# Patient Record
Sex: Male | Born: 1995
Health system: Southern US, Community
[De-identification: ages and names within clinical notes are randomized; demographics above are authoritative.]

## PROBLEM LIST (undated history)

## (undated) DIAGNOSIS — J45909 Unspecified asthma, uncomplicated: Secondary | ICD-10-CM

---
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
## 2017-03-25 ENCOUNTER — Encounter: Payer: Self-pay | Admitting: Family Medicine

## 2017-03-25 ENCOUNTER — Ambulatory Visit (INDEPENDENT_AMBULATORY_CARE_PROVIDER_SITE_OTHER): Payer: BLUE CROSS/BLUE SHIELD | Admitting: Family Medicine

## 2017-03-25 DIAGNOSIS — R0989 Other specified symptoms and signs involving the circulatory and respiratory systems: Secondary | ICD-10-CM

## 2017-03-25 DIAGNOSIS — J45909 Unspecified asthma, uncomplicated: Secondary | ICD-10-CM | POA: Insufficient documentation

## 2017-03-25 DIAGNOSIS — B078 Other viral warts: Secondary | ICD-10-CM | POA: Insufficient documentation

## 2017-03-25 DIAGNOSIS — Z808 Family history of malignant neoplasm of other organs or systems: Secondary | ICD-10-CM

## 2017-03-25 DIAGNOSIS — J4520 Mild intermittent asthma, uncomplicated: Secondary | ICD-10-CM

## 2017-03-25 DIAGNOSIS — J989 Respiratory disorder, unspecified: Secondary | ICD-10-CM | POA: Insufficient documentation

## 2017-03-25 DIAGNOSIS — B079 Viral wart, unspecified: Secondary | ICD-10-CM

## 2017-03-25 DIAGNOSIS — J3089 Other allergic rhinitis: Secondary | ICD-10-CM

## 2017-03-25 NOTE — Patient Instructions (Signed)
 Generalized Anxiety Disorder, Adult  Generalized anxiety disorder (GAD) is a mental health disorder. People with this condition constantly worry about everyday events. Unlike normal anxiety, worry related to GAD is not triggered by a specific event. These worries also do not fade or get better with time. GAD interferes with life functions, including relationships, work, and school. GAD can vary from mild to severe. People with severe GAD can have intense waves of anxiety with physical symptoms (panic attacks). What are the causes? The exact cause of GAD is not known. What increases the risk? This condition is more likely to develop in:  Women.  People who have a family history of anxiety disorders.  People who are very shy.  People who experience very stressful life events, such as the death of a loved one.  People who have a very stressful family environment.  What are the signs or symptoms? People with GAD often worry excessively about many things in their lives, such as their health and family. They may also be overly concerned about:  Doing well at work.  Being on time.  Natural disasters.  Friendships.  Physical symptoms of GAD include:  Fatigue.  Muscle tension or having muscle twitches.  Trembling or feeling shaky.  Being easily startled.  Feeling like your heart is pounding or racing.  Feeling out of breath or like you cannot take a deep breath.  Having trouble falling asleep or staying asleep.  Sweating.  Nausea, diarrhea, or irritable bowel syndrome (IBS).  Headaches.  Trouble concentrating or remembering facts.  Restlessness.  Irritability.  How is this diagnosed? Your health care provider can diagnose GAD based on your symptoms and medical history. You will also have a physical exam. The health care provider will ask specific questions about your symptoms, including how severe they are, when they started, and if they come and go. Your health  care provider may ask you about your use of alcohol or drugs, including prescription medicines. Your health care provider may refer you to a mental health specialist for further evaluation. Your health care provider will do a thorough examination and may perform additional tests to rule out other possible causes of your symptoms. To be diagnosed with GAD, a person must have anxiety that:  Is out of his or her control.  Affects several different aspects of his or her life, such as work and relationships.  Causes distress that makes him or her unable to take part in normal activities.  Includes at least three physical symptoms of GAD, such as restlessness, fatigue, trouble concentrating, irritability, muscle tension, or sleep problems.  Before your health care provider can confirm a diagnosis of GAD, these symptoms must be present more days than they are not, and they must last for six months or longer. How is this treated? The following therapies are usually used to treat GAD:  Medicine. Antidepressant medicine is usually prescribed for long-term daily control. Antianxiety medicines may be added in severe cases, especially when panic attacks occur.  Talk therapy (psychotherapy). Certain types of talk therapy can be helpful in treating GAD by providing support, education, and guidance. Options include: ? Cognitive behavioral therapy (CBT). People learn coping skills and techniques to ease their anxiety. They learn to identify unrealistic or negative thoughts and behaviors and to replace them with positive ones. ? Acceptance and commitment therapy (ACT). This treatment teaches people how to be mindful as a way to cope with unwanted thoughts and feelings. ? Biofeedback. This process trains   you to manage your body's response (physiological response) through breathing techniques and relaxation methods. You will work with a therapist while machines are used to monitor your physical symptoms.  Stress  management techniques. These include yoga, meditation, and exercise.  A mental health specialist can help determine which treatment is best for you. Some people see improvement with one type of therapy. However, other people require a combination of therapies. Follow these instructions at home:  Take over-the-counter and prescription medicines only as told by your health care provider.  Try to maintain a normal routine.  Try to anticipate stressful situations and allow extra time to manage them.  Practice any stress management or self-calming techniques as taught by your health care provider.  Do not punish yourself for setbacks or for not making progress.  Try to recognize your accomplishments, even if they are small.  Keep all follow-up visits as told by your health care provider. This is important. Contact a health care provider if:  Your symptoms do not get better.  Your symptoms get worse.  You have signs of depression, such as: ? A persistently sad, cranky, or irritable mood. ? Loss of enjoyment in activities that used to bring you joy. ? Change in weight or eating. ? Changes in sleeping habits. ? Avoiding friends or family members. ? Loss of energy for normal tasks. ? Feelings of guilt or worthlessness. Get help right away if:  You have serious thoughts about hurting yourself or others. If you ever feel like you may hurt yourself or others, or have thoughts about taking your own life, get help right away. You can go to your nearest emergency department or call:  Your local emergency services (911 in the [place]  A suicide crisis helpline, such as the National Suicide Prevention Lifeline at 1-800-273-8255. This is open 24 hours a day.  Summary  Generalized anxiety disorder (GAD) is a mental health disorder that involves worry that is not triggered by a specific event.  People with GAD often worry excessively about many things in their lives, such as their health and  family.  GAD may cause physical symptoms such as restlessness, trouble concentrating, sleep problems, frequent sweating, nausea, diarrhea, headaches, and trembling or muscle twitching.  A mental health specialist can help determine which treatment is best for you. Some people see improvement with one type of therapy. However, other people require a combination of therapies. This information is not intended to replace advice given to you by your health care provider. Make sure you discuss any questions you have with your health care provider. Document Released: 08/11/2012 Document Revised: 03/06/2016 Document Reviewed: 03/06/2016 Elsevier Interactive Patient Education  2018 [employer]      Living With Anxiety  After being diagnosed with an anxiety disorder, you may be relieved to know why you have felt or behaved a certain way. It is natural to also feel overwhelmed about the treatment ahead and what it will mean for your life. With care and support, you can manage this condition and recover from it. How to cope with anxiety Dealing with stress Stress is your body's reaction to life changes and events, both good and bad. Stress can last just a few hours or it can be ongoing. Stress can play a major role in anxiety, so it is important to learn both how to cope with stress and how to think about it differently. Talk with your health care provider or a counselor to learn more about stress reduction. He   or she may suggest some stress reduction techniques, such as:  Music therapy. This can include creating or listening to music that you enjoy and that inspires you.  Mindfulness-based meditation. This involves being aware of your normal breaths, rather than trying to control your breathing. It can be done while sitting or walking.  Centering prayer. This is a kind of meditation that involves focusing on a word, phrase, or sacred image that is meaningful to you and that brings you peace.  Deep  breathing. To do this, expand your stomach and inhale slowly through your nose. Hold your breath for 3-5 seconds. Then exhale slowly, allowing your stomach muscles to relax.  Self-talk. This is a skill where you identify thought patterns that lead to anxiety reactions and correct those thoughts.  Muscle relaxation. This involves tensing muscles then relaxing them.  Choose a stress reduction technique that fits your lifestyle and personality. Stress reduction techniques take time and practice. Set aside 5-15 minutes a day to do them. Therapists can offer training in these techniques. The training may be covered by some insurance plans. Other things you can do to manage stress include:  Keeping a stress diary. This can help you learn what triggers your stress and ways to control your response.  Thinking about how you respond to certain situations. You may not be able to control everything, but you can control your reaction.  Making time for activities that help you relax, and not feeling guilty about spending your time in this way.  Therapy combined with coping and stress-reduction skills provides the best chance for successful treatment. Medicines Medicines can help ease symptoms. Medicines for anxiety include:  Anti-anxiety drugs.  Antidepressants.  Beta-blockers.  Medicines may be used as the main treatment for anxiety disorder, along with therapy, or if other treatments are not working. Medicines should be prescribed by a health care provider. Relationships Relationships can play a big part in helping you recover. Try to spend more time connecting with trusted friends and family members. Consider going to couples counseling, taking family education classes, or going to family therapy. Therapy can help you and others better understand the condition. How to recognize changes in your condition Everyone has a different response to treatment for anxiety. Recovery from anxiety happens when  symptoms decrease and stop interfering with your daily activities at home or work. This may mean that you will start to:  Have better concentration and focus.  Sleep better.  Be less irritable.  Have more energy.  Have improved memory.  It is important to recognize when your condition is getting worse. Contact your health care provider if your symptoms interfere with home or work and you do not feel like your condition is improving. Where to find help and support: You can get help and support from these sources:  Self-help groups.  Online and community organizations.  A trusted spiritual leader.  Couples counseling.  Family education classes.  Family therapy.  Follow these instructions at home:  Eat a healthy diet that includes plenty of vegetables, fruits, whole grains, low-fat dairy products, and lean protein. Do not eat a lot of foods that are high in solid fats, added sugars, or salt.  Exercise. Most adults should do the following: ? Exercise for at least 150 minutes each week. The exercise should increase your heart rate and make you sweat (moderate-intensity exercise). ? Strengthening exercises at least twice a week.  Cut down on caffeine, tobacco, alcohol, and other potentially harmful substances.    substances.  Get the right amount and quality of sleep. Most adults need 7-9 hours of sleep each night.  Make choices that simplify your life.  Take over-the-counter and prescription medicines only as told by your health care provider.  Avoid caffeine, alcohol, and certain over-the-counter cold medicines. These may make you feel worse. Ask your pharmacist which medicines to avoid.  Keep all follow-up visits as told by your health care provider. This is important. Questions to ask your health care provider  Would I benefit from therapy?  How often should I follow up with a health care provider?  How long do I need to take medicine?  Are there any  long-term side effects of my medicine?  Are there any alternatives to taking medicine? Contact a health care provider if:  You have a hard time staying focused or finishing daily tasks.  You spend many hours a day feeling worried about everyday life.  You become exhausted by worry.  You start to have headaches, feel tense, or have nausea.  You urinate more than normal.  You have diarrhea. Get help right away if:  You have a racing heart and shortness of breath.  You have thoughts of hurting yourself or others. If you ever feel like you may hurt yourself or others, or have thoughts about taking your own life, get help right away. You can go to your nearest emergency department or call:  Your local emergency services (911 in the [place]  A suicide crisis helpline, such as the National Suicide Prevention Lifeline at 215-861-1255. This is open 24-hours a day.  Summary  Taking steps to deal with stress can help calm you.  Medicines cannot cure anxiety disorders, but they can help ease symptoms.  Family, friends, and partners can play a big part in helping you recover from an anxiety disorder. This information is not intended to replace advice given to you by your health care provider. Make sure you discuss any questions you have with your health care provider. Document Released: 04/10/2016 Document Revised: 04/10/2016 Document Reviewed: 04/10/2016 Elsevier Interactive Patient Education  2018 [employer].    Please realize, EXERCISE IS MEDICINE!  -  American Heart Association ( AHA) guidelines for exercise : If you are in good health, without any medical conditions, you should engage in 150 minutes of moderate intensity aerobic activity per week.  This means you should be huffing and puffing throughout your workout.   Engaging in regular exercise will improve brain function and memory, as well as improve mood, boost immune system and help with weight management.  As well as the  other, more well-known effects of exercise such as decreasing blood sugar levels, decreasing blood pressure,  and decreasing bad cholesterol levels/ increasing good cholesterol levels.     -  The AHA strongly endorses consumption of a diet that contains a variety of foods from all the food categories with an emphasis on fruits and vegetables; fat-free and low-fat dairy products; cereal and grain products; legumes and nuts; and fish, poultry, and/or extra lean meats.    Excessive food intake, especially of foods high in saturated and trans fats, sugar, and salt, should be avoided.    Adequate water intake of roughly 1/2 of your weight in pounds, should equal the ounces of water per day you should drink.  So for instance, if you're 200 pounds, that would be 100 ounces of water per day.         Mediterranean Diet  Why follow  it? Research shows. . Those who follow the Mediterranean diet have a reduced risk of heart disease  . The diet is associated with a reduced incidence of Parkinson's and Alzheimer's diseases . People following the diet may have longer life expectancies and lower rates of chronic diseases  . The Dietary Guidelines for Americans recommends the Mediterranean diet as an eating plan to promote health and prevent disease  What Is the Mediterranean Diet?  . Healthy eating plan based on typical foods and recipes of Mediterranean-style cooking . The diet is primarily a plant based diet; these foods should make up a majority of meals   Starches - Plant based foods should make up a majority of meals - They are an important sources of vitamins, minerals, energy, antioxidants, and fiber - Choose whole grains, foods high in fiber and minimally processed items  - Typical grain sources include wheat, oats, barley, corn, brown rice, bulgar, farro, millet, polenta, couscous  - Various types of beans include chickpeas, lentils, fava beans, black beans, white beans   Fruits  Veggies - Large  quantities of antioxidant rich fruits & veggies; 6 or more servings  - Vegetables can be eaten raw or lightly drizzled with oil and cooked  - Vegetables common to the traditional Mediterranean Diet include: artichokes, arugula, beets, broccoli, brussel sprouts, cabbage, carrots, celery, collard greens, cucumbers, eggplant, kale, leeks, lemons, lettuce, mushrooms, okra, onions, peas, peppers, potatoes, pumpkin, radishes, rutabaga, shallots, spinach, sweet potatoes, turnips, zucchini - Fruits common to the Mediterranean Diet include: apples, apricots, avocados, cherries, clementines, dates, figs, grapefruits, grapes, melons, nectarines, oranges, peaches, pears, pomegranates, strawberries, tangerines  Fats - Replace butter and margarine with healthy oils, such as olive oil, canola oil, and tahini  - Limit nuts to no more than a handful a day  - Nuts include walnuts, almonds, pecans, pistachios, pine nuts  - Limit or avoid candied, honey roasted or heavily salted nuts - Olives are central to the [employer] diet - can be eaten whole or used in a variety of dishes   Meats Protein - Limiting red meat: no more than a few times a month - When eating red meat: choose lean cuts and keep the portion to the size of deck of cards - Eggs: approx. 0 to 4 times a week  - Fish and lean poultry: at least 2 a week  - Healthy protein sources include, chicken, [place], lean beef, lamb - Increase intake of seafood such as tuna, salmon, trout, mackerel, shrimp, scallops - Avoid or limit high fat processed meats such as sausage and bacon  Dairy - Include moderate amounts of low fat dairy products  - Focus on healthy dairy such as fat free yogurt, skim milk, low or reduced fat cheese - Limit dairy products higher in fat such as whole or 2% milk, cheese, ice cream  Alcohol - Moderate amounts of red wine is ok  - No more than 5 oz daily for women (all ages) and men older than age 20  - No more than 10 oz of wine daily  for men younger than 2465  Other - Limit sweets and other desserts  - Use herbs and spices instead of salt to flavor foods  - Herbs and spices common to the traditional Mediterranean Diet include: basil, bay leaves, chives, cloves, cumin, fennel, garlic, lavender, marjoram, mint, oregano, parsley, pepper, rosemary, sage, savory, sumac, tarragon, thyme   It's not just a diet, it's a lifestyle:  . The Mediterranean diet includes lifestyle  factors typical of those in the region  . Foods, drinks and meals are best eaten with others and savored . Daily physical activity is important for overall good health . This could be strenuous exercise like running and aerobics . This could also be more leisurely activities such as walking, housework, yard-work, or taking the stairs . Moderation is the key; a balanced and healthy diet accommodates most foods and drinks . Consider portion sizes and frequency of consumption of certain foods   Meal Ideas & Options:  . Breakfast:  o Whole wheat toast or whole wheat English muffins with peanut butter & hard boiled egg o Steel cut oats topped with apples & cinnamon and skim milk  o Fresh fruit: banana, strawberries, melon, berries, peaches  o Smoothies: strawberries, bananas, greek yogurt, peanut butter o Low fat greek yogurt with blueberries and granola  o Egg white omelet with spinach and mushrooms o Breakfast couscous: whole wheat couscous, apricots, skim milk, cranberries  . Sandwiches:  o Hummus and grilled vegetables (peppers, zucchini, squash) on whole wheat bread   o Grilled chicken on whole wheat pita with lettuce, tomatoes, cucumbers or tzatziki  o Tuna salad on whole wheat bread: tuna salad made with greek yogurt, olives, red peppers, capers, green onions o Garlic rosemary lamb pita: lamb sauted with garlic, rosemary, salt & pepper; add lettuce, cucumber, greek yogurt to pita - flavor with lemon juice and black pepper  . Seafood:  o Mediterranean  grilled salmon, seasoned with garlic, basil, parsley, lemon juice and black pepper o Shrimp, lemon, and spinach whole-grain pasta salad made with low fat greek yogurt  o Seared scallops with lemon orzo  o Seared tuna steaks seasoned salt, pepper, coriander topped with tomato mixture of olives, tomatoes, olive oil, minced garlic, parsley, green onions and cappers  . Meats:  o Herbed greek chicken salad with kalamata olives, cucumber, feta  o Red bell peppers stuffed with spinach, bulgur, lean ground beef (or lentils) & topped with feta   o Kebabs: skewers of chicken, tomatoes, onions, zucchini, squash  o [place] burgers: made with red onions, mint, dill, lemon juice, feta cheese topped with roasted red peppers . Vegetarian o Cucumber salad: cucumbers, artichoke hearts, celery, red onion, feta cheese, tossed in olive oil & lemon juice  o Hummus and whole grain pita points with a greek salad (lettuce, tomato, feta, olives, cucumbers, red onion) o Lentil soup with celery, carrots made with vegetable broth, garlic, salt and pepper  o Tabouli salad: parsley, bulgur, mint, scallions, cucumbers, tomato, radishes, lemon juice, olive oil, salt and pepper.

## 2017-03-25 NOTE — Progress Notes (Signed)
New patient office visit note:  Impression and Recommendations:    1. Mild intermittent childhood asthma without complication   2. Environmental and seasonal allergies   3. Reactive airway disease that is not asthma     1. Mild intermittent childhood asthma without complication ***  2. Environmental and seasonal allergies ***  3. Reactive airway disease that is not asthma ***   1. Mild intermittent childhood asthma without complication  2. Environmental and seasonal allergies  3. Reactive airway disease that is not asthma   No problem-specific Assessment & Plan notes found for this encounter.    Education and routine counseling performed. Handouts provided.  No orders of the defined types were placed in this encounter.   Gross side effects, risk and benefits, and alternatives of medications discussed with patient.  Patient is aware that all medications have potential side effects and we are unable to predict every side effect or drug-drug interaction that may occur.  Expresses verbal understanding and consents to current therapy plan and treatment regimen.  No Follow-up on file.  Please see AVS handed out to patient at the end of our visit for further patient instructions/ counseling done pertaining to today's office visit.    Note: This document was prepared using Dragon voice recognition software and may include unintentional dictation errors.  ----------------------------------------------------------------------------------------------------------------------    Subjective:    Chief complaint:   Chief Complaint  Patient presents with  . Establish Care     HPI: Nicholas Meadows is a pleasant 21 y.o. male who [facility] to review their medical history with me and establish care.   I asked the patient to review their chronic problem list with me to ensure everything was updated and accurate.    All  recent office visits with other providers, any medical records that patient brought in etc  - I reviewed today.     Also asked pt to get me medical records from [facility] providers/ specialists that they had seen within the past 3-5 years- if they are in private practice and/or do not work for a [facility], [facility], [facility], [facility] or [facility] owned practice.  Told them to call their specialists to clarify this if they are not sure.    -Right hand patient noticed a dark spot which is approximately 2 mm in diameter.  Been there for approximately a year and a half.  Has not changed in that time span.  Came on after he noticed some verrucea in the area.   Nop bleed, no itch, no changes in appearance.    Problem  Childhood Asthma Without Complication   Patient had childhood asthma but has not had to use his albuterol in many years now.  Usually worse in the winter when the cold weather hits.  Has a albuterol inhaler as well as nebulizer at home to use as needed   Environmental and Seasonal Allergies   OTC allergy meds and nose spray's- Spring and fall.     Reactive Airway Disease That Is Not Asthma      Wt Readings from Last 3 Encounters:  03/25/17 131 lb (59.4 kg) (12 %, Z= -1.16)*  10/21/11 130 lb 4.7 oz (59.1 kg) (68 %, Z= 0.47)*   * Growth percentiles are based on CDC (Boys, 2-20 Years) data.   BP Readings from Last 3 Encounters:  03/25/17 138/80 (95 %, Z = 1.64 /  88 %, Z =  1.19)*  10/21/11 121/78   *BP percentiles are based on the August 2017 AAP Clinical Practice Guideline for boys   Pulse Readings from Last 3 Encounters:  03/25/17 (!) 106  10/21/11 72   BMI Readings from Last 3 Encounters:  03/25/17 20.52 kg/m (17 %, Z= -0.95)*   * Growth percentiles are based on CDC (Boys, 2-20 Years) data.    Patient Care Team    Relationship Specialty Notifications Start End  Thomasene Lotpalski, Monifah Freehling, DO PCP - General Family Medicine  03/19/17     Patient Active Problem List   Diagnosis Date  Noted  . Childhood asthma without complication 03/25/2017  . Environmental and seasonal allergies 03/25/2017  . Reactive airway disease that is not asthma 03/25/2017     Past Medical History:  Diagnosis Date  . Asthma      Past Medical History:  Diagnosis Date  . Asthma      No past surgical history on file.   No family history on file.   Social History   Substance and Sexual Activity  Drug Use No     Social History   Substance and Sexual Activity  Alcohol Use No  . Frequency: Never     Social History   Tobacco Use  Smoking Status Current Every Day Smoker  . Types: E-cigarettes  Smokeless Tobacco Never Used  Tobacco Comment   patient uses vapor     Outpatient Encounter Medications as of 03/25/2017  Medication Sig  . albuterol (PROVENTIL) (2.5 MG/3ML) 0.083% nebulizer solution Take 2.5 mg by nebulization every 6 (six) hours as needed. For shortness of breath.  . [DISCONTINUED] PRESCRIPTION MEDICATION Inhale 1 puff into the lungs every 6 (six) hours as needed. Asthma inhaler. For shortness of breath.  . [DISCONTINUED] PRESCRIPTION MEDICATION Inhale 1 puff into the lungs daily as needed. Asthma rescue inhaler. For wheezing.   No facility-administered encounter medications on file as of 03/25/2017.     Allergies: Patient has no known allergies.   ROS   Objective:   Blood pressure 138/80, pulse (!) 106, height 5\' 7"  (1.702 m), weight 131 lb (59.4 kg). Body mass index is 20.52 kg/m. General: Well Developed, well nourished, and in no acute distress.  Neuro: Alert and oriented x3, extra-ocular muscles intact, sensation grossly intact.  HEENT:[place]/AT, PERRLA, neck supple, No carotid bruits Skin: no gross rashes  Cardiac: Regular rate and rhythm Respiratory: Essentially clear to auscultation bilaterally. Not using accessory muscles, speaking in full sentences.  Abdominal: not grossly distended Musculoskeletal: Ambulates w/o diff, FROM * 4 ext.  Vasc:  less 2 sec cap RF, warm and pink  Psych:  No HI/SI, judgement and insight good, Euthymic mood. Full Affect.    No results found for this or any previous visit (from the past 2160 hour(s)).

## 2017-09-10 ENCOUNTER — Ambulatory Visit (INDEPENDENT_AMBULATORY_CARE_PROVIDER_SITE_OTHER): Payer: BLUE CROSS/BLUE SHIELD | Admitting: Family Medicine

## 2017-09-10 ENCOUNTER — Encounter: Payer: Self-pay | Admitting: Family Medicine

## 2017-09-10 VITALS — BP 127/82 | HR 77 | Ht 67.0 in | Wt 152.3 lb

## 2017-09-10 DIAGNOSIS — H6123 Impacted cerumen, bilateral: Secondary | ICD-10-CM

## 2017-09-10 DIAGNOSIS — J4520 Mild intermittent asthma, uncomplicated: Secondary | ICD-10-CM | POA: Diagnosis not present

## 2017-09-10 DIAGNOSIS — J3089 Other allergic rhinitis: Secondary | ICD-10-CM | POA: Diagnosis not present

## 2017-09-10 DIAGNOSIS — H938X3 Other specified disorders of ear, bilateral: Secondary | ICD-10-CM | POA: Diagnosis not present

## 2017-09-10 MED ORDER — ALBUTEROL SULFATE HFA 108 (90 BASE) MCG/ACT IN AERS: 1.0000 | INHALATION_SPRAY | Freq: Four times a day (QID) | RESPIRATORY_TRACT | 2 refills | Status: DC | PRN

## 2017-09-10 MED ORDER — ALBUTEROL SULFATE (2.5 MG/3ML) 0.083% IN NEBU: 2.5000 mg | INHALATION_SOLUTION | Freq: Four times a day (QID) | RESPIRATORY_TRACT | 1 refills | Status: DC | PRN

## 2017-09-10 NOTE — Progress Notes (Signed)
Acute Care Office visit  Assessment and plan:  1. Bilateral hearing loss due to cerumen impaction   2. Excessive cerumen in ear canal, bilateral   3. Ear pressure, bilateral   4. Mild intermittent childhood asthma without complication   5. Environmental and seasonal allergies    1. Bilateral Cerumen Impaction of the Ears - Advised the patient to use half hydrogen peroxide and half rubbing alcohol twice weekly to clean his ears out.  - -Advised the patient to begin using Netipot, AYR or Neilmed sinus rinses BID followed by flonase BID (one spray to each nostril).  Advised that the patient may also incorporate allegra D PRN.   Indication: Bilateral Cerumen impaction of the ear(s) with hearing loss and pain.  - Physician procedure with soft curette successfully removed cerumen from LEFT ear.  Medical necessity statement:  On physical examination, cerumen impairs clinically significant portions of the external auditory canal, and tympanic membrane.  Noted obstructive, copious cerumen that cannot be removed without magnification and instrumentations requiring physician skills Consent:  Discussed benefits and risks of procedure and verbal consent obtained Procedure:  Utilized an otoscope to assess and take note of the ear canal, the tympanic membrane, and the presence, amount, and placement of the cerumen.  Soft plastic curette was utilized to remove cerumen from left EAC in totality.  Exam of left TM within normal limits and EAC with area of slight bleeding from where cerumen was removed.  Post procedure examination:  shows cerumen was removed, without trauma or injury to the ear canal or TM, which remains intact.   Post-Procedural Ear Care Instructions:    Patient tolerated procedure well.  Proper ear care d/c pt.   The patient is made aware that they may experience temporary vertigo, temporary hearing loss, and temporary discomfort.  If these symptom last for more than 24 hours to call the  clinic or proceed to the ED/Urgent Care.  Indication: Cerumen impaction of the Right Ear - TM not visualized at all.  Attempt with soft curette by physician was not successful, x2 attempts.  CMA to flush ear.  Medical necessity statement:  On physical examination, cerumen impairs clinically significant portions of the external auditory canal, and tympanic membrane.  Noted obstructive, copious cerumen that cannot be removed without magnification and instrumentations requiring physician skills Consent:  Discussed benefits and risks of procedure and verbal consent obtained Procedure:   Patient was prepped for the procedure.  Utilized an otoscope to assess and take note of the ear canal, the tympanic membrane, and the presence, amount, and placement of the cerumen. Gentle water irrigation was utilized to remove cerumen. Post procedure examination:  shows cerumen was removed, without trauma or injury to the ear canal or TM, which remains intact.  TM with minimal protuberance, no air fluid level, and no erythema. Post-Procedural Ear Care Instructions:    Patient tolerated procedure well.  Proper ear care d/c pt.   The patient is made aware that they may experience temporary vertigo, temporary hearing loss, and temporary discomfort.  If these symptom last for more than 24 hours to call the clinic or proceed to the ED/Urgent Care.  2. Asthma - Well controlled at this time.  Patient's maintenance medications refilled today.  - Will continue to monitor.  Advised patient to use his rescue inhaler sparingly.  3. Follow-Up - Return for regularly scheduled chronic follow-up every 6 months, and as needed.    Meds ordered this encounter  Medications  .  DISCONTD: albuterol (PROVENTIL) (2.5 MG/3ML) 0.083% nebulizer solution    Sig: Take 3 mLs (2.5 mg total) by nebulization every 6 (six) hours as needed. For shortness of breath.    Dispense:  75 mL    Refill:  1  . DISCONTD: albuterol (PROVENTIL HFA;VENTOLIN  HFA) 108 (90 Base) MCG/ACT inhaler    Sig: Inhale 1-2 puffs into the lungs every 6 (six) hours as needed for wheezing or shortness of breath (Only as needed for shortness of breath).    Dispense:  1 Inhaler    Refill:  2  . albuterol (PROVENTIL HFA;VENTOLIN HFA) 108 (90 Base) MCG/ACT inhaler    Sig: Inhale 1-2 puffs into the lungs every 6 (six) hours as needed for wheezing or shortness of breath (Only as needed for shortness of breath).    Dispense:  1 Inhaler    Refill:  2  . albuterol (PROVENTIL) (2.5 MG/3ML) 0.083% nebulizer solution    Sig: Take 3 mLs (2.5 mg total) by nebulization every 6 (six) hours as needed. For shortness of breath.    Dispense:  75 mL    Refill:  1     Gross side effects, risk and benefits, and alternatives of medications discussed with patient.  Patient is aware that all medications have potential side effects and we are unable to predict every sideeffect or drug-drug interaction that may occur.  Expresses verbal understanding and consents to current therapy plan and treatment regiment.   Education and routine counseling performed. Handouts provided.  Anticipatory guidance and routine counseling done re: condition, txmnt options and need for follow up. All questions of patient's were answered.  Return in about 6 months (around 03/13/2018) for Follow-up for chronic care in 6 months and as needed additionally.  Please see AVS handed out to patient at the end of our visit for additional patient instructions/ counseling done pertaining to today's office visit.  Note: This document was partially repared using Dragon voice recognition software and may include unintentional dictation errors.  This document serves as a record of services personally performed by Thomasene Lot, DO. It was created on her behalf by Peggye Fothergill, a trained medical scribe. The creation of this record is based on the scribe's personal observations and the provider's statements to  them.   I have reviewed the above medical documentation for accuracy and completeness and I concur.  Thomasene Lot 09/10/17 5:11 PM    Subjective:    Chief Complaint  Patient presents with  . Ear Pain   Patient started a new job and is returning to school in the fall through his job.  Notes that he weighed 131 on 03/25/2017, and today 09/10/2017 weighs 152 lbs.  States that now he's eating right and exercising, trying to gain muscle.  Asthma is well controlled at this time.  Patient only uses about 2 puffs per day of his daily asthma treatment, as a precautionary measure.  HPI:  Pt presents with Sx for the past 2 days (starting yesterday morning).   C/o:  Notes that he woke up yesterday and couldn't hear out of his right ear.    As the day progressed, this worsened.  Notes that he felt fullness and pressure and as though water was in it.  Patient was on the swim team and knows what it feels like to have water in his ears.  States that yesterday, when he moved his head around, it felt like something was "sloshing" in his ear.  He laid on  his right side to try to get it to come out but didn't work; tried to shake his head and nothing came out.  Put saltwater solution in it but didn't pull anything out either.  Notes that his ear felt different after this.  Denies:  Ongoing pain.    For symptoms patient has tried:  Tried to wash his ear out with saltwater.  Overall getting:  Hearing loss has intensified over the past 24 hours.   2) mild intermittent asthma.   He is using his albuterol nebulizer solution less than 2 times per week on average.   Typically worse symptoms transitioning from winter to summer in the springtime as well as an fall but this year not as bad.   Denies any problems or shortness of breath.   Environmental seasonal allergies: Patient has not been taking anything for his allergies.  They have not been so bad this year.   Patient Care Team    Relationship  Specialty Notifications Start End  Thomasene Lot, DO PCP - General Family Medicine  03/19/17     Past medical history, Surgical history, Family history reviewed and noted below, Social history, Allergies, and Medications have been entered into the medical record, reviewed and changed as needed.   No Known Allergies  Review of Systems: - see above HPI for pertinent positives General:   No F/C, wt loss Pulm:   No DIB, pleuritic chest pain Card:  No CP, palpitations Abd:  No n/v/d or pain Ext:  No inc edema from baseline   Objective:   Blood pressure 127/82, pulse 77, height  (1.702 m), weight 152 lb 4.8 oz (69.1 kg), SpO2 99 %. Body mass index is 23.85 kg/m. General: Well Developed, well nourished, appropriate for stated age.  Neuro: Alert and oriented x3, extra-ocular muscles intact, sensation grossly intact.  HEENT: Normocephalic, atraumatic, pupils equal round reactive to light, neck supple, no masses, no painful lymphadenopathy, TM's intact B/L, excessive cerumen bilateral external auditory canals. Nares- patent, clear d/c, OP- clear, mild erythema, No TTP sinuses Skin: Warm and dry, no gross rash. Cardiac: RRR, S1 S2,  no murmurs rubs or gallops.  Respiratory: ECTA B/L and A/P, Not using accessory muscles, speaking in full sentences- unlabored. Vascular:  No gross lower ext edema, cap RF less 2 sec. Psych: No HI/SI, judgement and insight good, Euthymic mood. Full Affect.

## 2017-09-10 NOTE — Patient Instructions (Signed)
-Use one half solution of hydrogenperoxide and one half rubbing alcohol in your ears-half of a capful ea ear -  2 days/week.   Also, sterile saline nasal rinses, such as Milta Deiters med or AYR sinus rinses, can be very helpful and should be done twice daily- especially throughout the allergy season.   Remember you should use distilled water or previously boiled water to do this.  Then you may use over-the-counter Flonase 1 spray each nostril twice daily after sinus rinses.  You can do this in addition to taking any Allegra D or Claritin D or Zyrtec D etc. that you may be taking daily.  If your eyes tend to get an itchy or irritated feeling when your seasonal allergies get bad, you can use Naphcon-A over-the-counter eyedrops as needed      Ear Irrigation What is ear irrigation? Ear irrigation is a procedure to wash dirt and wax out of your ear canal. This procedure is also called lavage. You may need ear irrigation if you are having trouble hearing because of a buildup of earwax. You may also have ear irrigation as part of the treatment for an ear infection. Getting wax and dirt out of your ear canal can help some medicines given as ear drops work better. How is ear irrigation performed? The procedure may vary among health care providers and hospitals. You may be given ear drops to put in your ear 15-20 minutes before irrigation. This helps loosen the wax. Then, a syringe containing water and a sterile salt solution (saline) can be gently inserted into the ear canal. The saline is used to flush out wax and other debris. Ear irrigation kits are also available to use at home. Ask your health care provider if this is an option for you. Use a home irrigation kit only as told by your health care provider. Read the package instructions carefully. Follow the directions for using the syringe. Use water that is room temperature. Do not do ear irrigation at home if you:  Have diabetes. Diabetes increases the risk  of infection.  Have a hole or tear in your eardrum.  Have tubes in your ears.  What are the risks of ear irrigation? Generally, this is a safe procedure. However, problems may occur, including:  Infection.  Pain.  Hearing loss.  Pushing water and debris into the eardrum. This can occur if there are holes in the eardrum.  Ear irrigation failing to work.  How should I care for my ears after having them irrigated? Cleaning  Clean the outside of your ear with a soft washcloth daily.  If told by your health care provider, use a few drops of baby oil, mineral oil, glycerin, hydrogen peroxide, or over-the-counter earwax softening drops.  Do not use cotton swabs to clean your ears. These can push wax down into the ear canal.  Do not put anything into your ears to try to remove wax. This includes ear candles. General Instructions  Take over-the-counter and prescription medicines only as told by your health care provider.  If you were prescribed an antibiotic medicine, use it as told by your health care provider. Do not stop using the antibiotic even if your condition improves.  Keep all follow-up visits as told by your health care provider. This is important.  Visit your health care provider at least once a year to have your ears and hearing checked. When should I seek medical care? Seek medical care if:  Your hearing is not improving or  is getting worse.  You have pain or redness in your ear.  You have fluid, blood, or pus coming out of your ear.  This information is not intended to replace advice given to you by your health care provider. Make sure you discuss any questions you have with your health care provider. Document Released: 05/13/2015 Document Revised: 03/12/2016 Document Reviewed: 09/23/2014 Elsevier Interactive Patient Education  2017 [employer].

## 2017-09-24 ENCOUNTER — Ambulatory Visit: Payer: BLUE CROSS/BLUE SHIELD | Admitting: Family Medicine

## 2018-03-14 ENCOUNTER — Ambulatory Visit: Payer: BLUE CROSS/BLUE SHIELD | Admitting: Family Medicine

## 2018-05-01 ENCOUNTER — Encounter: Payer: Self-pay | Admitting: Family Medicine

## 2018-05-01 ENCOUNTER — Ambulatory Visit (INDEPENDENT_AMBULATORY_CARE_PROVIDER_SITE_OTHER): Payer: BLUE CROSS/BLUE SHIELD | Admitting: Adult Health

## 2018-05-01 ENCOUNTER — Encounter: Payer: Self-pay | Admitting: Adult Health

## 2018-05-01 VITALS — BP 134/83 | HR 70 | Temp 98.5°F | Ht 67.0 in | Wt 155.6 lb

## 2018-05-01 DIAGNOSIS — R112 Nausea with vomiting, unspecified: Secondary | ICD-10-CM

## 2018-05-01 DIAGNOSIS — R1110 Vomiting, unspecified: Secondary | ICD-10-CM | POA: Insufficient documentation

## 2018-05-01 MED ORDER — ONDANSETRON HCL 4 MG PO TABS: 4.0000 mg | ORAL_TABLET | Freq: Three times a day (TID) | ORAL | 0 refills | Status: DC | PRN

## 2018-05-01 NOTE — Progress Notes (Signed)
Subjective:    Patient ID: Nicholas Meadows, male    DOB: 11/18/1995, 23 y.o.   MRN: [id number]  HPI:  Nicholas Meadows presents with nausea and vomiting that started when waking this morning a 0545. He reports emesis at 0545. He went to work and felt "quesy" and has pre-existing fear of heights and was instructed to "go up in hoist lift about 40 feet to remove" objects from the ceiling. He reports while he was up in the lift he experienced "twisting pain" in upper abdominal area. He came down from lift and vomite again at about 0900. He reports normal bowel habits- daily BM He reports his BM yesterday was delayed a few hours, otherwise normal He has not had BM today He denies RUQ or RLQ pain He denies fever/chills/hematuria/hematochezia He denies urinary sx's  He reports eating soup last night, denies any ETOH use He reports vaping last night, but has been >1 year He denies any illicit drug use He recently travelled to [place] last week where his GF family members were acutely ill with URI sx's He has not used any OTC remedies He has hx of intermittent GERD, currently not taking PPI or H2 blocker   Patient Care Team    Relationship Specialty Notifications Start End  Thomasene Lotpalski, Deborah, DO PCP - General Family Medicine  03/19/17     Patient Active Problem List   Diagnosis Date Noted  . Non-intractable vomiting 05/01/2018  . Childhood asthma without complication 03/25/2017  . Environmental and seasonal allergies 03/25/2017  . Reactive airway disease that is not asthma 03/25/2017  . Family history of skin cancer- grandfather- old age. 03/25/2017  . Verruca simplex- R hand * 2 03/25/2017     Past Medical History:  Diagnosis Date  . Asthma      History reviewed. No pertinent surgical history.   History reviewed. No pertinent family history.   Social History   Substance and Sexual Activity  Drug Use No     Social History   Substance and Sexual Activity  Alcohol Use No  .  Frequency: Never     Social History   Tobacco Use  Smoking Status Current Every Day Smoker  . Types: E-cigarettes  Smokeless Tobacco Never Used  Tobacco Comment   patient uses vapor     Outpatient Encounter Medications as of 05/01/2018  Medication Sig  . albuterol (PROVENTIL HFA;VENTOLIN HFA) 108 (90 Base) MCG/ACT inhaler Inhale 1-2 puffs into the lungs every 6 (six) hours as needed for wheezing or shortness of breath (Only as needed for shortness of breath).  Marland Kitchen. albuterol (PROVENTIL) (2.5 MG/3ML) 0.083% nebulizer solution Take 3 mLs (2.5 mg total) by nebulization every 6 (six) hours as needed. For shortness of breath.  . ondansetron (ZOFRAN) 4 MG tablet Take 1 tablet (4 mg total) by mouth every 8 (eight) hours as needed for nausea or vomiting.   No facility-administered encounter medications on file as of 05/01/2018.     Allergies: Patient has no known allergies.  Body mass index is 24.37 kg/m.  Blood pressure 134/83, pulse 70, temperature 98.5 F (36.9 C), height 5\' 7"  (1.702 m), weight 155 lb 9.6 oz (70.6 kg), SpO2 100 %.  Review of Systems  Constitutional: Positive for appetite change and fatigue. Negative for activity change, chills, diaphoresis, fever and unexpected weight change.  HENT: Negative for congestion, sinus pressure and sinus pain.   Eyes: Negative for visual disturbance.  Respiratory: Negative for cough, chest tightness, shortness of breath, wheezing  and stridor.   Cardiovascular: Negative for chest pain, palpitations and leg swelling.  Gastrointestinal: Positive for abdominal pain, nausea and vomiting. Negative for abdominal distention, anal bleeding, blood in stool, constipation, diarrhea and rectal pain.  Genitourinary: Negative for difficulty urinating, flank pain and hematuria.  Musculoskeletal: Negative for arthralgias, back pain, gait problem, joint swelling, myalgias, neck pain and neck stiffness.  Skin: Negative for color change, pallor, rash and wound.   Neurological: Negative for dizziness.  Hematological: Does not bruise/bleed easily.  Psychiatric/Behavioral: Negative for sleep disturbance.       Objective:   Physical Exam Vitals signs and nursing note reviewed.  Constitutional:      General: He is not in acute distress.    Appearance: Normal appearance. He is normal weight. He is not ill-appearing, toxic-appearing or diaphoretic.  HENT:     Head: Normocephalic and atraumatic.     Right Ear: Tympanic membrane, ear canal and external ear normal.     Left Ear: Tympanic membrane, ear canal and external ear normal.     Nose: Rhinorrhea present. No congestion.     Mouth/Throat:     Mouth: Mucous membranes are moist.     Pharynx: No oropharyngeal exudate or posterior oropharyngeal erythema.  Eyes:     General: No scleral icterus.       Right eye: No discharge.        Left eye: No discharge.     Extraocular Movements: Extraocular movements intact.     Conjunctiva/sclera: Conjunctivae normal.     Pupils: Pupils are equal, round, and reactive to light.  Neck:     Musculoskeletal: Normal range of motion and neck supple.  Cardiovascular:     Rate and Rhythm: Tachycardia present.     Pulses: Normal pulses.     Heart sounds: Normal heart sounds. No murmur. No friction rub. No gallop.   Pulmonary:     Effort: Pulmonary effort is normal. No respiratory distress.     Breath sounds: Normal breath sounds. No stridor. No wheezing, rhonchi or rales.  Chest:     Chest wall: No tenderness.  Abdominal:     General: Abdomen is flat. Bowel sounds are normal. There is no distension or abdominal bruit. There are no signs of injury.     Palpations: Abdomen is soft. There is no mass.     Tenderness: There is abdominal tenderness in the epigastric area. There is no right CVA tenderness, left CVA tenderness, guarding or rebound. Negative signs include Murphy's sign, Rovsing's sign, McBurney's sign, psoas sign and obturator sign.     Hernia: No hernia  is present.     Comments: VERY mild epigastric pain with palpation  Lymphadenopathy:     Cervical: No cervical adenopathy.  Skin:    General: Skin is warm and dry.     Capillary Refill: Capillary refill takes less than 2 seconds.  Neurological:     Mental Status: He is alert and oriented to person, place, and time.  Psychiatric:        Mood and Affect: Mood normal.        Behavior: Behavior normal.        Thought Content: Thought content normal.        Judgment: Judgment normal.       Assessment & Plan:   1. Nausea and vomiting, intractability of vomiting not specified, unspecified vomiting type   2. Non-intractable vomiting with nausea, unspecified vomiting type     Non-intractable vomiting Sip fluids, follow  BRAT diet. Use Ondansetron (Zofran) as needed. Work Excuse provided, okay to return Monday 05/05/2018. We will call when lab results are available. If symptoms persist for another 2 weeks, please call clinic and we will send of abdominal ultrasound.    FOLLOW-UP:  Return if symptoms worsen or fail to improve.

## 2018-05-01 NOTE — Patient Instructions (Addendum)
Bland Diet A bland diet consists of foods that are often soft and do not have a lot of fat, fiber, or extra seasonings. Foods without fat, fiber, or seasoning are easier for the body to digest. They are also less likely to irritate your mouth, throat, stomach, and other parts of your digestive system. A bland diet is sometimes called a BRAT diet. What is my plan? Your health care provider or food and nutrition specialist (dietitian) may recommend specific changes to your diet to prevent symptoms or to treat your symptoms. These changes may include:  Eating small meals often.  Cooking food until it is soft enough to chew easily.  Chewing your food well.  Drinking fluids slowly.  Not eating foods that are very spicy, sour, or fatty.  Not eating citrus fruits, such as oranges and grapefruit. What do I need to know about this diet?  Eat a variety of foods from the bland diet food list.  Do not follow a bland diet longer than needed.  Ask your health care provider whether you should take vitamins or supplements. What foods can I eat? Grains  Hot cereals, such as cream of wheat. Rice. Bread, crackers, or tortillas made from refined white flour. Vegetables Canned or cooked vegetables. Mashed or boiled potatoes. Fruits  Bananas. Applesauce. Other types of cooked or canned fruit with the skin and seeds removed, such as canned peaches or pears. Meats and other proteins  Scrambled eggs. Creamy peanut butter or other nut butters. Lean, well-cooked meats, such as chicken or fish. Tofu. Soups or broths. Dairy Low-fat dairy products, such as milk, cottage cheese, or yogurt. Beverages  Water. Herbal tea. Apple juice. Fats and oils Mild salad dressings. Canola or olive oil. Sweets and desserts Pudding. Custard. Fruit gelatin. Ice cream. The items listed above may not be a complete list of recommended foods and beverages. Contact a dietitian for more options. What foods are not  recommended? Grains Whole grain breads and cereals. Vegetables Raw vegetables. Fruits Raw fruits, especially citrus, berries, or dried fruits. Dairy Whole fat dairy foods. Beverages Caffeinated drinks. Alcohol. Seasonings and condiments Strongly flavored seasonings or condiments. Hot sauce. Salsa. Other foods Spicy foods. Fried foods. Sour foods, such as pickled or fermented foods. Foods with high sugar content. Foods high in fiber. The items listed above may not be a complete list of foods and beverages to avoid. Contact a dietitian for more information. Summary  A bland diet consists of foods that are often soft and do not have a lot of fat, fiber, or extra seasonings.  Foods without fat, fiber, or seasoning are easier for the body to digest.  Check with your health care provider to see how long you should follow this diet plan. It is not meant to be followed for long periods. This information is not intended to replace advice given to you by your health care provider. Make sure you discuss any questions you have with your health care provider. Document Released: 08/08/2015 Document Revised: 05/15/2017 Document Reviewed: 05/15/2017 Elsevier Interactive Patient Education  2019 [employer].  Sip fluids, follow [employer] Use Ondansetron (Zofran) as needed. Work Excuse provided, okay to return Monday 05/05/2018. We will call when lab results are available. If symptoms persist for another 2 weeks, please call clinic and we will send of abdominal ultrasound. FEEL BETTER!

## 2018-05-01 NOTE — Assessment & Plan Note (Signed)
Sip fluids, follow BRAT diet. Use Ondansetron (Zofran) as needed. Work Excuse provided, okay to return Monday 05/05/2018. We will call when lab results are available. If symptoms persist for another 2 weeks, please call clinic and we will send of abdominal ultrasound.

## 2018-05-02 LAB — COMPREHENSIVE METABOLIC PANEL
A/G RATIO: 1.6 (ref 1.2–2.2)
ALK PHOS: 100 IU/L (ref 39–117)
ALT: 19 IU/L (ref 0–44)
AST: 24 IU/L (ref 0–40)
Albumin: 4.7 g/dL (ref 3.5–5.5)
BILIRUBIN TOTAL: 0.3 mg/dL (ref 0.0–1.2)
BUN/Creatinine Ratio: 15 (ref 9–20)
BUN: 12 mg/dL (ref 6–20)
CHLORIDE: 101 mmol/L (ref 96–106)
CO2: 23 mmol/L (ref 20–29)
Calcium: 10.2 mg/dL (ref 8.7–10.2)
Creatinine, Ser: 0.82 mg/dL (ref 0.76–1.27)
GFR calc Af Amer: 146 mL/min/{1.73_m2} (ref >59)
GFR calc non Af Amer: 126 mL/min/{1.73_m2} (ref >59)
GLUCOSE: 83 mg/dL (ref 65–99)
Globulin, Total: 2.9 g/dL (ref 1.5–4.5)
POTASSIUM: 4.4 mmol/L (ref 3.5–5.2)
SODIUM: 140 mmol/L (ref 134–144)
Total Protein: 7.6 g/dL (ref 6.0–8.5)

## 2018-05-02 LAB — CBC WITH DIFFERENTIAL/PLATELET
BASOS ABS: 0 10*3/uL (ref 0.0–0.2)
BASOS: 1 %
EOS (ABSOLUTE): 0.1 10*3/uL (ref 0.0–0.4)
Eos: 1 %
Hematocrit: 43.7 % (ref 37.5–51.0)
Hemoglobin: 14.5 g/dL (ref 13.0–17.7)
Immature Grans (Abs): 0 10*3/uL (ref 0.0–0.1)
Immature Granulocytes: 0 %
LYMPHS ABS: 1.6 10*3/uL (ref 0.7–3.1)
Lymphs: 24 %
MCH: 28.4 pg (ref 26.6–33.0)
MCHC: 33.2 g/dL (ref 31.5–35.7)
MCV: 86 fL (ref 79–97)
Monocytes Absolute: 0.5 10*3/uL (ref 0.1–0.9)
Monocytes: 8 %
NEUTROS ABS: 4.4 10*3/uL (ref 1.4–7.0)
Neutrophils: 66 %
PLATELETS: 287 10*3/uL (ref 150–450)
RBC: 5.11 x10E6/uL (ref 4.14–5.80)
RDW: 13.5 % (ref 12.3–15.4)
WBC: 6.5 10*3/uL (ref 3.4–10.8)

## 2018-06-13 ENCOUNTER — Encounter: Payer: Self-pay | Admitting: Family Medicine

## 2018-06-13 ENCOUNTER — Ambulatory Visit (INDEPENDENT_AMBULATORY_CARE_PROVIDER_SITE_OTHER): Payer: BLUE CROSS/BLUE SHIELD | Admitting: Family Medicine

## 2018-06-13 VITALS — BP 148/90 | HR 89 | Temp 98.3°F | Ht 67.0 in | Wt 148.9 lb

## 2018-06-13 DIAGNOSIS — J3089 Other allergic rhinitis: Secondary | ICD-10-CM | POA: Diagnosis not present

## 2018-06-13 DIAGNOSIS — J069 Acute upper respiratory infection, unspecified: Secondary | ICD-10-CM

## 2018-06-13 DIAGNOSIS — Z789 Other specified health status: Secondary | ICD-10-CM

## 2018-06-13 DIAGNOSIS — Z720 Tobacco use: Secondary | ICD-10-CM

## 2018-06-13 DIAGNOSIS — R0989 Other specified symptoms and signs involving the circulatory and respiratory systems: Secondary | ICD-10-CM | POA: Diagnosis not present

## 2018-06-13 DIAGNOSIS — J989 Respiratory disorder, unspecified: Secondary | ICD-10-CM

## 2018-06-13 MED ORDER — PREDNISONE 20 MG PO TABS: ORAL_TABLET | ORAL | 0 refills | Status: DC

## 2018-06-13 MED ORDER — METHYLPREDNISOLONE ACETATE 40 MG/ML IJ SUSP
40.0000 mg | Freq: Once | INTRAMUSCULAR | Status: AC
  Administered 2018-06-13: 40 mg via INTRAMUSCULAR

## 2018-06-13 MED ORDER — DEXAMETHASONE SODIUM PHOSPHATE 4 MG/ML IJ SOLN
4.0000 mg | Freq: Once | INTRAMUSCULAR | Status: AC
  Administered 2018-06-13: 4 mg via INTRAVENOUS

## 2018-06-13 NOTE — Progress Notes (Signed)
Acute Care Office visit  Assessment and plan:  1. Upper respiratory tract infection, unspecified type   2. Reactive airway disease that is not asthma   3. Environmental and seasonal allergies   4. Nicotine vapor product user     1. Upper Respiratory Tract Infection, History of Asthma - Viral vs Allergic vs Bacterial causes for pt's symptoms reveiwed.    - Supportive care and various OTC medications discussed in addition to any prescribed.  - Discussed use of steroids to help alleviate inflammation of the airways and speed up the recovery process.  Patient confirms that he has not been on prednisone in forever.  - Steroid injection prescribed today, with prednisone taper to start tomorrow.  - Call or RTC if new symptoms, or if no improvement or worse over next several days.    - Will consider ABX if sx continue past 10 days and worsening.  - Encouraged patient to continue with his goals to quit vaping as well as quit drinking so much soda.    Meds ordered this encounter  Medications  . predniSONE (DELTASONE) 20 MG tablet    Sig: Take 3 tabs po * 2 days, then 2 tabs for 2 d, then 1 tab 2 d, then 1/2 tab 2 days.    Dispense:  15 tablet    Refill:  0    Medications Discontinued During This Encounter  Medication Reason  . ondansetron (ZOFRAN) 4 MG tablet No longer needed (for PRN medications)     No orders of the defined types were placed in this encounter.   Gross side effects, risk and benefits, and alternatives of medications discussed with patient.  Patient is aware that all medications have potential side effects and we are unable to predict every sideeffect or drug-drug interaction that may occur.  Expresses verbal understanding and consents to current therapy plan and treatment regiment.   Education and routine counseling performed. Handouts provided.  Anticipatory guidance and routine counseling done re: condition, txmnt options and need for follow up. All  questions of patient's were answered.  Return if symptoms worsen or fail to improve, for F-up of current med issues as previously d/c pt.  Please see AVS handed out to patient at the end of our visit for additional patient instructions/ counseling done pertaining to today's office visit.  Note:  This document was partially repared using Dragon voice recognition software and may include unintentional dictation errors.  This document serves as a record of services personally performed by Thomasene Lot, DO. It was created on her behalf by Peggye Fothergill, a trained medical scribe. The creation of this record is based on the scribe's personal observations and the provider's statements to them.   I have reviewed the above medical documentation for accuracy and completeness and I concur.  Thomasene Lot, DO 06/13/2018 12:39 PM       Subjective:    Chief Complaint  Patient presents with  . Cough    HPI:  Pt presents with Sx for 3 days  Started having a sore throat on Tuesday, 3 days ago.  A person he was around told him after the fact that they had a lung infection.   C/o:  Has had a sore throat, "like I've been coughing, but I have not been coughing."  Describes it as a "wet soreness."  "It's not like when I've had mono or strep throat."  Feels congested in his nose.  His biggest concern was his chest, given his  history of asthma.  A little short of breath, but not wheezy.  States he has had chest pain in his right lung.  In the past, had similar symptoms which he thought was strain on his sternum from work.  Denies:  Denies sinus headache or pressure.  Denies fevers.  Is not coughing at night, "not that's keeping me up, so I don't think so."  Denies pain while taking a deep breath.  In general, denies chest pain on palpation.    For symptoms patient has tried:  Has tried OTC cold and flu relief stuff.  When he took daytime medication on Thursday, he felt better  throughout the day.  He's been using his nebulizer every day this week.  Was told by his mom that it sounded like bronchitis.  Overall getting:  States that today is the best he's felt since it happened.  Patient notes that he plans to stop vaping.  Feels he's been exercising and eating right, and drinking lemon water in the morning instead of sodas.  Still drinking sodas once a day, but trying to get to where he doesn't need a soda at all.    Patient Care Team    Relationship Specialty Notifications Start End  Thomasene Lot, DO PCP - General Family Medicine  03/19/17     Past medical history, Surgical history, Family history reviewed and noted below, Social history, Allergies, and Medications have been entered into the medical record, reviewed and changed as needed.   No Known Allergies  Review of Systems: - see above HPI for pertinent positives General:   No F/C, wt loss Pulm:   No DIB, pleuritic chest pain Card:  No CP, palpitations Abd:  No n/v/d or pain Ext:  No inc edema from baseline   Objective:   Blood pressure (!) 148/90, pulse 89, temperature 98.3 F (36.8 C), height 5\' 7"  (1.702 m), weight 148 lb 14.4 oz (67.5 kg), SpO2 100 %. Body mass index is 23.32 kg/m. General: Well Developed, well nourished, appropriate for stated age.  Neuro: Alert and oriented x3, extra-ocular muscles intact, sensation grossly intact.  HEENT: Normocephalic, atraumatic, pupils equal round reactive to light, neck supple, no masses, no painful lymphadenopathy, TM's intact B/L, no acute findings. Nares- patent, clear d/c, OP- clear, mild erythema, No TTP sinuses Skin: Warm and dry, no gross rash. Cardiac: RRR, S1 S2,  no murmurs rubs or gallops.  Respiratory: ECTA B/L and A/P, Not using accessory muscles, speaking in full sentences- unlabored. Vascular:  No gross lower ext edema, cap RF less 2 sec. Psych: No HI/SI, judgement and insight good, Euthymic mood. Full Affect.

## 2018-06-13 NOTE — Addendum Note (Signed)
Addended by: Leda Min D on: 06/13/2018 01:33 PM   Modules accepted: Orders

## 2018-06-13 NOTE — Patient Instructions (Addendum)
Start oral prednisone tomorrow  - please stop vaping.     Great job on cutting back caffeine/ soda!!!     Upper Respiratory Infection, Adult  An upper respiratory infection (URI) affects the nose, throat, and upper air passages. URIs are caused by germs (viruses). The most common type of URI is often called "the common cold." Medicines cannot cure URIs, but you can do things at home to relieve your symptoms. URIs usually get better within 7-10 days. Follow these instructions at home: Activity  Rest as needed.  If you have a fever, stay home from work or school until your fever is gone, or until your doctor says you may return to work or school. ? You should stay home until you cannot spread the infection anymore (you are not contagious). ? Your doctor may have you wear a face mask so you have less risk of spreading the infection. Relieving symptoms  Gargle with a salt-water mixture 3-4 times a day or as needed. To make a salt-water mixture, completely dissolve -1 tsp of salt in 1 cup of warm water.  Use a cool-mist humidifier to add moisture to the air. This can help you breathe more easily. Eating and drinking   Drink enough fluid to keep your pee (urine) pale yellow.  Eat soups and other clear broths. General instructions   Take over-the-counter and prescription medicines only as told by your doctor. These include cold medicines, fever reducers, and cough suppressants.  Do not use any products that contain nicotine or tobacco. These include cigarettes and e-cigarettes. If you need help quitting, ask your doctor.  Avoid being where people are smoking (avoid secondhand smoke).  Make sure you get regular shots and get the flu shot every year.  Keep all follow-up visits as told by your doctor. This is important. How to avoid spreading infection to others   Wash your hands often with soap and water. If you do not have soap and water, use hand sanitizer.  Avoid touching  your mouth, face, eyes, or nose.  Cough or sneeze into a tissue or your sleeve or elbow. Do not cough or sneeze into your hand or into the air. Contact a doctor if:  You are getting worse, not better.  You have any of these: ? A fever. ? Chills. ? Brown or red mucus in your nose. ? Yellow or brown fluid (discharge)coming from your nose. ? Pain in your face, especially when you bend forward. ? Swollen neck glands. ? Pain with swallowing. ? White areas in the back of your throat. Get help right away if:  You have shortness of breath that gets worse.  You have very bad or constant: ? Headache. ? Ear pain. ? Pain in your forehead, behind your eyes, and over your cheekbones (sinus pain). ? Chest pain.  You have long-lasting (chronic) lung disease along with any of these: ? Wheezing. ? Long-lasting cough. ? Coughing up blood. ? A change in your usual mucus.  You have a stiff neck.  You have changes in your: ? Vision. ? Hearing. ? Thinking. ? Mood. Summary  An upper respiratory infection (URI) is caused by a germ called a virus. The most common type of URI is often called "the common cold."  URIs usually get better within 7-10 days.  Take over-the-counter and prescription medicines only as told by your doctor. This information is not intended to replace advice given to you by your health care provider. Make sure you discuss any  questions you have with your health care provider. Document Released: 10/03/2007 Document Revised: 12/07/2016 Document Reviewed: 12/07/2016 Elsevier Interactive Patient Education  2019 [employer].

## 2018-07-22 ENCOUNTER — Telehealth (INDEPENDENT_AMBULATORY_CARE_PROVIDER_SITE_OTHER): Payer: BLUE CROSS/BLUE SHIELD | Admitting: Family Medicine

## 2018-07-22 ENCOUNTER — Other Ambulatory Visit: Payer: Self-pay

## 2018-07-22 ENCOUNTER — Encounter: Payer: Self-pay | Admitting: Family Medicine

## 2018-07-22 DIAGNOSIS — K219 Gastro-esophageal reflux disease without esophagitis: Secondary | ICD-10-CM | POA: Insufficient documentation

## 2018-07-22 DIAGNOSIS — B354 Tinea corporis: Secondary | ICD-10-CM | POA: Insufficient documentation

## 2018-07-22 DIAGNOSIS — B353 Tinea pedis: Secondary | ICD-10-CM | POA: Insufficient documentation

## 2018-07-22 MED ORDER — FLUCONAZOLE 150 MG PO TABS: ORAL_TABLET | ORAL | 0 refills | Status: DC

## 2018-07-22 MED ORDER — CLOTRIMAZOLE 1 % EX CREA: 1.0000 "application " | TOPICAL_CREAM | Freq: Two times a day (BID) | CUTANEOUS | 0 refills | Status: DC

## 2018-07-22 NOTE — Progress Notes (Signed)
Virtual Visit via Telephone Note  I connected with Valma Cava on 07/22/18 at  4:15 PM EDT by telephone and verified that I am speaking with the correct person using two identifiers.   I discussed the limitations, risks, security and privacy concerns of performing an evaluation and management service by telephone and the availability of in person appointments. I also discussed with the patient that there may be a patient responsible charge related to this service. The patient expressed understanding and agreed to proceed.   History of Present Illness: Past couple weeks- at least a month. Bumps on him in clusters.  Little raised red bumps, on top of them, appears to be flaky skin.  No itch, burning etc.    On stomach, arms and started on foot..  Seems to come and go.  Worse at end of day shift of working.  Not in groin.   -  Tried nothing for them except benadryl orally- no help, 1% HC cream- no improvement  -Per patient he also has heartburn on a regular basis and only occasionally takes an over-the-counter "heartburn medicine "for it.  In the mornings when he awakens from sleep he also can feel so nauseous that at times he vomits.  This only happens 2-3 times per month at most.  He is wondering what he can do for this as well.  He also told me he missed work today due to vomiting because his reflux was so bad    Observations/Objective: No vitals available to me     Assessment and Plan:   -Tinea pedis---> tinea corporis  Treat with clotrimazole cream, oral Diflucan as patient desired a "quicker way to treat this.  He declined going on oral Lamisil due to the need for checking liver enzymes etc. and did not want to come into the office.  -Explained to patient that topical treatment can take up to 6 to 8 weeks to begin to see resolution.  I explained to him is important to stay dry and leave things open to air as much as possible.  -Advised patient to change shoes regularly and make sure  he disinfects them every month or so   -GERD causing even nausea and vomiting at seldom times  -Told patient to use over-the-counter H2 blockers on a daily basis which she has not been doing.  -Told patient to also do a Google search on reflux and foods and lifestyle changes you can do to help with symptoms.     I discussed the assessment and treatment plan with the patient. The patient was provided an opportunity to ask questions and all were answered. The patient agreed with the plan and demonstrated an understanding of the instructions.   The patient was advised to call back or seek an in-person evaluation if the symptoms worsen or if the condition fails to improve as anticipated.  I provided at least 13 minutes of non-face-to-face time during this encounter.   Thomasene Lot, DO

## 2018-07-31 ENCOUNTER — Telehealth: Payer: Self-pay

## 2018-07-31 NOTE — Telephone Encounter (Signed)
Patient had appointment for rash via tele-med on 07/22/2018 patient was given clotrimazole and fluconazole.Patient states that the rash has gotten worse and is now very dry.  Patient states that he has used the medication and has let the areas air out and kept them dry. Patient describes the areas as red bumps with flaky skin over the bumps and very dry and irritated.  Patient states that it is worse after taking a showing.  It is on the patients lt foot and ankle, arms and back, and the worse area is on his abd.  Denies any itching but the areas burn when anything is applied to them. Patient states that he did have a soret throat 1-2 weeks before and is wondering if it was strep could that be th cause of the rash.  Please review and advise. MPulliam, CMA/RT(R)

## 2018-08-03 NOTE — Telephone Encounter (Signed)
I told pt that this rash would take 8-12 wks potentially to clear- after my OV with him- it was clear to me.   Tell him I am glad it is not worse and also it is drying out.   It will be worse after showering, sweating/ getting wet etc. - he can't avoid that.  This is something that will lasts weeks and weeks and should NOT worsen if he follows treatemnet plan we discussed.

## 2018-08-04 ENCOUNTER — Other Ambulatory Visit: Payer: Self-pay

## 2018-08-04 DIAGNOSIS — J4520 Mild intermittent asthma, uncomplicated: Secondary | ICD-10-CM

## 2018-08-04 DIAGNOSIS — J3089 Other allergic rhinitis: Secondary | ICD-10-CM

## 2018-08-04 MED ORDER — ALBUTEROL SULFATE (2.5 MG/3ML) 0.083% IN NEBU: 2.5000 mg | INHALATION_SOLUTION | Freq: Four times a day (QID) | RESPIRATORY_TRACT | 1 refills | Status: DC | PRN

## 2018-08-04 NOTE — Telephone Encounter (Signed)
Patient states that the rash has improved over the weekend and seems to be doing fine at the moment.  Patient will cont treating the areas and will follow up as needed. MPulliam, CMA/RT(R)

## 2018-09-09 ENCOUNTER — Telehealth: Payer: Self-pay | Admitting: Family Medicine

## 2018-09-09 NOTE — Telephone Encounter (Signed)
Patient says he pulled a tick off Rt shoulder last night but was unable to get the head out- ( the area around it very irritated)a little worried & ask to be called back with any care  instructions/suggestions.  Pt states he was seen for a rash in March 2020 on feet/legs (they healed but still on his stomach) he request a referral to a Dermatologist.  --Forwarding message to medical assistant for review & pt call back @ [phone].  --glh

## 2018-09-09 NOTE — Telephone Encounter (Signed)
Called and left message for the patient to call back. mpulliam

## 2018-09-09 NOTE — Telephone Encounter (Signed)
Patient will call back to make appointment for Thursday. MPulliam, CMA/RT(R)

## 2018-09-10 NOTE — Telephone Encounter (Signed)
Patient made appt for tomorrow 09/11/2018. MPulliam, CMA/RT(R)

## 2018-09-11 ENCOUNTER — Encounter: Payer: Self-pay | Admitting: Family Medicine

## 2018-09-11 ENCOUNTER — Other Ambulatory Visit: Payer: Self-pay

## 2018-09-11 ENCOUNTER — Ambulatory Visit (INDEPENDENT_AMBULATORY_CARE_PROVIDER_SITE_OTHER): Payer: BLUE CROSS/BLUE SHIELD | Admitting: Family Medicine

## 2018-09-11 VITALS — BP 129/88 | HR 89 | Temp 98.3°F | Ht 67.0 in | Wt 143.5 lb

## 2018-09-11 DIAGNOSIS — R21 Rash and other nonspecific skin eruption: Secondary | ICD-10-CM | POA: Diagnosis not present

## 2018-09-11 DIAGNOSIS — W57XXXA Bitten or stung by nonvenomous insect and other nonvenomous arthropods, initial encounter: Secondary | ICD-10-CM | POA: Diagnosis not present

## 2018-09-11 NOTE — Progress Notes (Signed)
Pt here for an acute care OV today   Impression and Recommendations:    1. Tick bite, initial encounter   2. Rash and nonspecific skin eruption      Tick bite, initial encounter -Reassured patient that since tick was on the body no more than 2 hours at best, chance of infection transmission is extremely slim.  However precautions for new rash, fevers, influenza-like illness etc. was reviewed with patient and he will let us know if he develops any. -CDC handouts provided to patient.  Rash and nonspecific skin eruption - Plan: Ambulatory referral to Dermatology -Patient has had this rash which is not been amenable to steroid cream or antifungal. -Patient prefers to go to dermatology for skin biopsy and definitive treatment    Medications Discontinued During This Encounter  Medication Reason  . clotrimazole (KP CLOTRIMAZOLE) 1 % cream Ineffective  . fluconazole (DIFLUCAN) 150 MG tablet Completed Course  . predniSONE (DELTASONE) 20 MG tablet Completed Course     Orders Placed This Encounter  Procedures  . Ambulatory referral to Dermatology     Education and routine counseling performed. Handouts provided  Gross side effects, risk and benefits, and alternatives of medications and treatment plan in general discussed with patient.  Patient is aware that all medications have potential side effects and we are unable to predict every side effect or drug-drug interaction that may occur.   Patient will call with any questions prior to using medication if they have concerns.    Expresses verbal understanding and consents to current therapy and treatment regimen.  No barriers to understanding were identified.  Red flag symptoms and signs discussed in detail.  Patient expressed understanding regarding what to do in case of emergency\urgent symptoms   Please see AVS handed out to patient at the end of our visit for further patient instructions/ counseling done pertaining to today's  office visit.   Return if symptoms worsen or fail to improve, for F-up of current med issues as previously d/c pt.     Note:  This document was prepared occasionally using Dragon voice recognition software and may include unintentional dictation errors in addition to a scribe.      --------------------------------------------------------------------------------------------------------------------------------------------------------------------------------------------------------------------------------------------    Subjective:    CC:  Chief Complaint  Patient presents with  . Tick Removal    HPI: Nicholas Meadows is a 23 y.o. male who presents to [facility] today for issues as discussed below.  Patient notes that on Saturday after he was out in the woods he felt a tick bite him on the right shoulder.  He said that where he was the tick could not have been on him more than 30 to 60 minutes.  He denies any symptoms Fever: No New rash: No Flu like illness: No  -He also has ongoing rash on his chest and abdomen which has failed both antifungals and steroid creams.  He is asking for dermatology referral today.   Wt Readings from Last 3 Encounters:  09/11/18 143 lb 8 oz (65.1 kg)  06/13/18 148 lb 14.4 oz (67.5 kg)  05/01/18 155 lb 9.6 oz (70.6 kg)   BP Readings from Last 3 Encounters:  09/11/18 129/88  06/13/18 (!) 148/90  05/01/18 134/83   BMI Readings from Last 3 Encounters:  09/11/18 22.48 kg/m  06/13/18 23.32 kg/m  05/01/18 24.37 kg/m     Patient Care Team    Relationship Specialty Notifications Start End  Thomasene Lot,  DO PCP - General Family Medicine  03/19/17      Patient Active Problem List   Diagnosis Date Noted  . Chronic GERD-  07/22/2018  . Tinea pedis of both feet 07/22/2018  . Tinea corporis 07/22/2018  . Nicotine vapor product user 06/13/2018  . Non-intractable vomiting 05/01/2018  . Childhood asthma without  complication 03/25/2017  . Environmental and seasonal allergies 03/25/2017  . Reactive airway disease that is not asthma 03/25/2017  . Family history of skin cancer- grandfather- old age. 03/25/2017  . Verruca simplex- R hand * 2 03/25/2017      Past Medical History:  Diagnosis Date  . Asthma      History reviewed. No pertinent surgical history.   History reviewed. No pertinent family history.   Social History   Socioeconomic History  . Marital status: Single    Spouse name: Not on file  . Number of children: Not on file  . Years of education: Not on file  . Highest education level: Not on file  Occupational History  . Not on file  Social Needs  . Financial resource strain: Not on file  . Food insecurity:    Worry: Not on file    Inability: Not on file  . Transportation needs:    Medical: Not on file    Non-medical: Not on file  Tobacco Use  . Smoking status: Current Every Day Smoker    Types: E-cigarettes  . Smokeless tobacco: Never Used  . Tobacco comment: patient uses vapor  Substance and Sexual Activity  . Alcohol use: No    Frequency: Never  . Drug use: No  . Sexual activity: Yes  Lifestyle  . Physical activity:    Days per week: Not on file    Minutes per session: Not on file  . Stress: Not on file  Relationships  . Social connections:    Talks on phone: Not on file    Gets together: Not on file    Attends religious service: Not on file    Active member of club or organization: Not on file    Attends meetings of clubs or organizations: Not on file    Relationship status: Not on file  . Intimate partner violence:    Fear of current or ex partner: Not on file    Emotionally abused: Not on file    Physically abused: Not on file    Forced sexual activity: Not on file  Other Topics Concern  . Not on file  Social History Narrative  . Not on file     Current Meds  Medication Sig  . albuterol (PROVENTIL HFA;VENTOLIN HFA) 108 (90 Base) MCG/ACT  inhaler Inhale 1-2 puffs into the lungs every 6 (six) hours as needed for wheezing or shortness of breath (Only as needed for shortness of breath).  Marland Kitchen albuterol (PROVENTIL) (2.5 MG/3ML) 0.083% nebulizer solution Take 3 mLs (2.5 mg total) by nebulization every 6 (six) hours as needed. For shortness of breath.    Allergies:  No Known Allergies   Review of Systems: General:   Denies fever, chills, unexplained weight loss.  Optho/Auditory:   Denies visual changes, blurred vision/LOV Respiratory:   Denies wheeze, DOE more than baseline levels.   Cardiovascular:   Denies chest pain, palpitations, new onset peripheral edema  Gastrointestinal:   Denies nausea, vomiting, diarrhea, abd pain.  Genitourinary: Denies dysuria, freq/ urgency, flank pain or discharge from genitals.  Endocrine:     Denies hot or cold intolerance, polyuria,  polydipsia. Musculoskeletal:   Denies unexplained myalgias, joint swelling, unexplained arthralgias, gait problems.  Skin:  Denies new onset rash, suspicious lesions Neurological:     Denies dizziness, unexplained weakness, numbness  Psychiatric/Behavioral:   Denies mood changes, suicidal or homicidal ideations, hallucinations    Objective:   Blood pressure 129/88, pulse 89, temperature 98.3 F (36.8 C), height 5\' 7"  (1.702 m), weight 143 lb 8 oz (65.1 kg), SpO2 100 %. Body mass index is 22.48 kg/m. General:  Well Developed, well nourished, appropriate for stated age.  Neuro:  Alert and oriented,  extra-ocular muscles intact  HEENT:  Normocephalic, atraumatic, neck supple Skin:  no gross rash, warm, pink.  Right shoulder over tattoo with small skin lesion less than 0.2 mm length.  Thoroughly inspected.  No retained arthropod pieces in skin after in-depth inspection with medical lights and magnifying glass.  Tissue flipped and inverted.  Free of insult.  Reassured patient.  It is right over a tattoo with black ink which gives the impression there is black pieces of  tick still left in it. Cardiac:  RRR, S1 S2 Respiratory:  ECTA B/L and A/P, Not using accessory muscles, speaking in full sentences- unlabored. Vascular:  Ext warm, no cyanosis apprec.; cap RF less 2 sec. Psych:  No HI/SI, judgement and insight good, Euthymic mood. Full Affect.

## 2018-09-11 NOTE — Patient Instructions (Signed)
Please also give the patient CDC handouts on tick removal of which I printed.   Tick Bite Information, Adult  Ticks are insects that draw blood for food. Most ticks live in shrubs and grassy areas. They climb onto people and animals that brush against the leaves and grasses that they rest on. Then they bite, attaching themselves to the skin. Most ticks are harmless, but some ticks carry germs that can spread to a person through a bite and cause a disease. To reduce your risk of getting a disease from a tick bite, it is important to take steps to prevent tick bites. It is also important to check for ticks after being outdoors. If you find that a tick has attached to you, watch for symptoms of disease. How can I prevent tick bites? Take these steps to help prevent tick bites when you are outdoors in an area where ticks are found:  Use insect repellent that has DEET (20% or higher), picaridin, or IR3535 in it. Use it on: ? Skin that is showing. ? The top of your boots. ? Your pant legs. ? Your sleeve cuffs.  For repellent products that contain permethrin, follow product instructions. Use these products on: ? Clothing. ? Gear. ? Boots. ? Tents.  Wear protective clothing. Long sleeves and long pants offer the best protection from ticks.  Wear light-colored clothing so you can see ticks more easily.  Tuck your pant legs into your socks.  If you go walking on a trail, stay in the middle of the trail so your skin, hair, and clothing do not touch the bushes.  Avoid walking through areas with long grass.  Check for ticks on your clothing, hair, and skin often while you are outside, and check again before you go inside. Make sure to check the places that ticks attach themselves most often. These places include the scalp, neck, armpits, waist, groin, and joint areas. Ticks that carry a disease called Lyme disease have to be attached to the skin for 24-48 hours. Checking for ticks every day will  lessen your risk of this and other diseases.  When you come indoors, wash your clothes and take a shower or a bath right away. Dry your clothes in a dryer on high heat for at least 60 minutes. This will kill any ticks in your clothes. What is the proper way to remove a tick? If you find a tick on your body, remove it as soon as possible. Removing a tick sooner rather than later can prevent germs from passing from the tick to your body. To remove a tick that is crawling on your skin but has not bitten:  Go outdoors and brush the tick off.  Remove the tick with tape or a lint roller. To remove a tick that is attached to your skin:  Wash your hands.  If you have latex gloves, put them on.  Use tweezers, curved forceps, or a tick-removal tool to gently grasp the tick as close to your skin and the tick's head as possible.  Gently pull with steady, upward pressure until the tick lets go. When removing the tick: ? Take care to keep the tick's head attached to its body. ? Do not twist or jerk the tick. This can make the tick's head or mouth break off. ? Do not squeeze or crush the tick's body. This could force disease-carrying fluids from the tick into your body. Do not try to remove a tick with heat, alcohol, petroleum  jelly, or fingernail polish. Using these methods can cause the tick to salivate and regurgitate into your bloodstream, increasing your risk of getting a disease. What should I do after removing a tick?  Clean the bite area with soap and water, rubbing alcohol, or an iodine scrub.  If an antiseptic cream or ointment is available, apply a small amount to the bite site.  Wash and disinfect any instruments that you used to remove the tick. How should I dispose of a tick? To dispose of a live tick, use one of these methods:  Place it in rubbing alcohol.  Place it in a sealed bag or container.  Wrap it tightly in tape.  Flush it down the toilet. Contact a health care provider  if:  You have symptoms of a disease after a tick bite. Symptoms of a tick-borne disease can occur from moments after the tick bites to up to 30 days after a tick is removed. Symptoms include: ? Muscle, joint, or bone pain. ? Difficulty walking or moving your legs. ? Numbness in the legs. ? Paralysis. ? Red rash around the tick bite area that is shaped like a target or a "bull's-eye." ? Redness and swelling in the area of the tick bite. ? Fever. ? Repeated vomiting. ? Diarrhea. ? Weight loss. ? Tender, swollen lymph glands. ? Shortness of breath. ? Cough. ? Pain in the abdomen. ? Headache. ? Abnormal tiredness. ? A change in your level of consciousness. ? Confusion. Get help right away if:  You are not able to remove a tick.  A part of a tick breaks off and gets stuck in your skin.  Your symptoms get worse. Summary  Ticks may carry germs that can spread to a person through a bite and cause disease.  Wear protective clothing and use insect repellent to prevent tick bites. Follow product instructions.  If you find a tick on your body, remove it as soon as possible. If the tick is attached, do not try to remove with heat, alcohol, petroleum jelly, or fingernail polish.  Remove the attached tick using tweezers, curved forceps, or a tick-removal tool. Gently pull with steady, upward pressure until the tick lets go. Do not twist or jerk the tick. Do not squeeze or crush the tick's body.  If you have symptoms after being bitten by a tick, contact a health care provider. This information is not intended to replace advice given to you by your health care provider. Make sure you discuss any questions you have with your health care provider. Document Released: 04/13/2000 Document Revised: 01/27/2016 Document Reviewed: 01/27/2016 Elsevier Interactive Patient Education  2019 [employer].

## 2018-09-15 ENCOUNTER — Telehealth: Payer: Self-pay | Admitting: Family Medicine

## 2018-09-15 NOTE — Telephone Encounter (Signed)
Noted MPulliam, CMA/RT(R)  

## 2018-09-15 NOTE — Telephone Encounter (Signed)
[facility] reports that Patient was a "NO Show " for today's appt.---Per Dermatology Specialist patient cannot reschedule due to not calling to cancel prior to appointment.  --FYI to provider & referral coord.  --glh

## 2019-03-09 DIAGNOSIS — Z20828 Contact with and (suspected) exposure to other viral communicable diseases: Secondary | ICD-10-CM | POA: Diagnosis not present

## 2019-03-12 DIAGNOSIS — J3489 Other specified disorders of nose and nasal sinuses: Secondary | ICD-10-CM | POA: Diagnosis not present

## 2019-03-12 DIAGNOSIS — Z20828 Contact with and (suspected) exposure to other viral communicable diseases: Secondary | ICD-10-CM | POA: Diagnosis not present

## 2019-06-27 DIAGNOSIS — Z20828 Contact with and (suspected) exposure to other viral communicable diseases: Secondary | ICD-10-CM | POA: Diagnosis not present

## 2021-04-13 ENCOUNTER — Encounter: Payer: Self-pay | Admitting: Nurse Practitioner

## 2021-04-13 ENCOUNTER — Ambulatory Visit: Payer: BC Managed Care – PPO | Admitting: Nurse Practitioner

## 2021-04-13 ENCOUNTER — Other Ambulatory Visit: Payer: Self-pay

## 2021-04-13 VITALS — BP 113/75 | HR 73 | Temp 97.9°F | Ht 68.0 in | Wt 146.8 lb

## 2021-04-13 DIAGNOSIS — J4520 Mild intermittent asthma, uncomplicated: Secondary | ICD-10-CM

## 2021-04-13 DIAGNOSIS — R21 Rash and other nonspecific skin eruption: Secondary | ICD-10-CM | POA: Diagnosis not present

## 2021-04-13 DIAGNOSIS — L255 Unspecified contact dermatitis due to plants, except food: Secondary | ICD-10-CM

## 2021-04-13 DIAGNOSIS — J3089 Other allergic rhinitis: Secondary | ICD-10-CM | POA: Diagnosis not present

## 2021-04-13 MED ORDER — ALBUTEROL SULFATE HFA 108 (90 BASE) MCG/ACT IN AERS: 1.0000 | INHALATION_SPRAY | Freq: Four times a day (QID) | RESPIRATORY_TRACT | 2 refills | Status: DC | PRN

## 2021-04-13 MED ORDER — TRIAMCINOLONE ACETONIDE 0.1 % EX OINT: 1.0000 "application " | TOPICAL_OINTMENT | Freq: Two times a day (BID) | CUTANEOUS | 2 refills | Status: DC

## 2021-04-13 MED ORDER — ALBUTEROL SULFATE (2.5 MG/3ML) 0.083% IN NEBU: 2.5000 mg | INHALATION_SOLUTION | Freq: Four times a day (QID) | RESPIRATORY_TRACT | 1 refills | Status: DC | PRN

## 2021-04-13 MED ORDER — METHYLPREDNISOLONE 4 MG PO TBPK: ORAL_TABLET | ORAL | 0 refills | Status: DC

## 2021-04-13 NOTE — Progress Notes (Signed)
Acute Office Visit  Subjective:    Patient ID: Nicholas Meadows, male    DOB: [birth year], 25 y.o.   MRN: [id number]  Chief Complaint  Patient presents with   Poison [place]ers    The patient states that he first noted poison ivy rash on the right wrist on Tuesday. States that by Wednesday, he noted the rash on his face. Tis is present on both sides of the face near the chin.   Poison [place]ce [place]ers This is a new problem. The current episode started in the past 7 days. The problem has been gradually worsening since onset. The affected locations include the left arm, right arm and face. He was exposed to plant contact. Associated symptoms include rhinorrhea. Pertinent negatives include no congestion, cough, diarrhea, fatigue, fever, shortness of breath, sore throat or vomiting. Past treatments include anti-itch cream. The treatment provided mild relief.    Past Medical History:  Diagnosis Date   Asthma     History reviewed. No pertinent surgical history.  History reviewed. No pertinent family history.  Social History   Socioeconomic History   Marital status: Single    Spouse name: Not on file   Number of children: Not on file   Years of education: Not on file   Highest education level: Not on file  Occupational History   Not on file  Tobacco Use   Smoking status: Every Day    Types: E-cigarettes   Smokeless tobacco: Never   Tobacco comments:    patient uses vapor  Vaping Use   Vaping Use: Never used  Substance and Sexual Activity   Alcohol use: No   Drug use: No   Sexual activity: Yes  Other Topics Concern   Not on file  Social History Narrative   Not on file   Social Determinants of Health   Financial Resource Strain: Not on file  Food Insecurity: Not on file  Transportation Needs: Not on file  Physical Activity: Not on file  Stress: Not on file  Social Connections: Not on file  Intimate Partner Violence: Not on file    Outpatient Medications Prior to Visit  Medication  Sig Dispense Refill   albuterol (PROVENTIL HFA;VENTOLIN HFA) 108 (90 Base) MCG/ACT inhaler Inhale 1-2 puffs into the lungs every 6 (six) hours as needed for wheezing or shortness of breath (Only as needed for shortness of breath). 1 Inhaler 2   albuterol (PROVENTIL) (2.5 MG/3ML) 0.083% nebulizer solution Take 3 mLs (2.5 mg total) by nebulization every 6 (six) hours as needed. For shortness of breath. 75 mL 1   No facility-administered medications prior to visit.    No Known Allergies  Review of Systems  Constitutional:  Negative for activity change, chills, fatigue and fever.  HENT:  Positive for rhinorrhea. Negative for congestion, postnasal drip, sinus pressure, sinus pain, sneezing and sore throat.   Eyes: Negative.   Respiratory:  Positive for wheezing. Negative for cough and shortness of breath.   Cardiovascular:  Negative for chest pain and palpitations.  Gastrointestinal:  Negative for constipation, diarrhea, nausea and vomiting.  Endocrine: Negative for cold intolerance, heat intolerance, polydipsia and polyuria.  Genitourinary:  Negative for dysuria, frequency and urgency.  Musculoskeletal:  Negative for back pain and myalgias.  Skin:  Positive for rash.       Poison ivy rash on lower legs, hand, forearms, neck, and face.  Allergic/Immunologic: Negative for environmental allergies.  Neurological:  Negative for dizziness, weakness and headaches.  Psychiatric/Behavioral:  The patient  is not nervous/anxious.       Objective:    Physical Exam Vitals and nursing note reviewed.  Constitutional:      Appearance: Normal appearance. He is well-developed.  HENT:     Head: Normocephalic and atraumatic.     Nose: Congestion present.     Mouth/Throat:     Mouth: Mucous membranes are moist.     Pharynx: Oropharynx is clear.  Eyes:     Extraocular Movements: Extraocular movements intact.     Conjunctiva/sclera: Conjunctivae normal.     Pupils: Pupils are equal, round, and reactive  to light.  Cardiovascular:     Rate and Rhythm: Normal rate and regular rhythm.     Pulses: Normal pulses.     Heart sounds: Normal heart sounds.  Pulmonary:     Effort: Pulmonary effort is normal.     Breath sounds: Normal breath sounds.  Abdominal:     Palpations: Abdomen is soft.  Musculoskeletal:        General: Normal range of motion.     Cervical back: Normal range of motion and neck supple.  Lymphadenopathy:     Cervical: No cervical adenopathy.  Skin:    General: Skin is warm and dry.     Capillary Refill: Capillary refill takes less than 2 seconds.     Findings: Rash present.     Comments: Patches of blisterlike rash present on the lower legs, the forearms, the hands, and the cheeks.  Some rashes are scabbed.  There is localized erythema.  Skin is intact.  No drainage present.  Neurological:     General: No focal deficit present.     Mental Status: He is alert and oriented to person, place, and time.  Psychiatric:        Mood and Affect: Mood normal.        Behavior: Behavior normal.        Thought Content: Thought content normal.        Judgment: Judgment normal.    Today's Vitals   04/13/21 1145  BP: 113/75  Pulse: 73  Temp: 97.9 F (36.6 C)  SpO2: 99%  Weight: 146 lb 12.8 oz (66.6 kg)  Height: 5\' 8"  (1.727 m)   Body mass index is 22.32 kg/m.   Wt Readings from Last 3 Encounters:  04/13/21 146 lb 12.8 oz (66.6 kg)  09/11/18 143 lb 8 oz (65.1 kg)  06/13/18 148 lb 14.4 oz (67.5 kg)    Health Maintenance Due  Topic Date Due   COVID-19 Vaccine (1) Never done   Pneumococcal Vaccine 44-18 Years old (1 - PPSV23 if available, else PCV20) 04/04/2000   HPV VACCINES (1 - Male 2-dose series) Never done   HIV Screening  Never done   Hepatitis C Screening  Never done   TETANUS/TDAP  12/21/2018   INFLUENZA VACCINE  11/28/2020       Topic Date Due   HPV VACCINES (1 - Male 2-dose series) Never done     No results found for: TSH Lab Results  Component  Value Date   WBC 6.5 05/01/2018   HGB 14.5 05/01/2018   HCT 43.7 05/01/2018   MCV 86 05/01/2018   PLT 287 05/01/2018   Lab Results  Component Value Date   NA 140 05/01/2018   K 4.4 05/01/2018   CO2 23 05/01/2018   GLUCOSE 83 05/01/2018   BUN 12 05/01/2018   CREATININE 0.82 05/01/2018   BILITOT 0.3 05/01/2018   ALKPHOS 100 05/01/2018  AST 24 05/01/2018   ALT 19 05/01/2018   PROT 7.6 05/01/2018   ALBUMIN 4.7 05/01/2018   CALCIUM 10.2 05/01/2018   No results found for: CHOL No results found for: HDL No results found for: LDLCALC No results found for: TRIG No results found for: CHOLHDL No results found for: HUDJ4H     Assessment & Plan:  1. Contact dermatitis due to plant Start Medrol taper.  Take as directed for 6 days.  Triamcinolone ointment may be applied to affected areas twice daily as needed for itching and irritation.  May also apply calamine lotion as needed. - methylPREDNISolone (MEDROL) 4 MG TBPK tablet; Take by mouth as directed for 6 days  Dispense: 21 tablet; Refill: 0  2. Rash and nonspecific skin eruption Start Medrol taper.  Take as directed for 6 days.  Triamcinolone ointment may be applied to affected areas twice daily as needed for itching and irritation. - methylPREDNISolone (MEDROL) 4 MG TBPK tablet; Take by mouth as directed for 6 days  Dispense: 21 tablet; Refill: 0 - triamcinolone ointment (KENALOG) 0.1 %; Apply 1 application topically 2 (two) times daily.  Dispense: 80 g; Refill: 2  3. Mild intermittent childhood asthma without complication New prescriptions for both albuterol rescue inhaler albuterol nebulizer solution sent to the patient's pharmacy.  He should use it as needed and as prescribed. - albuterol (VENTOLIN HFA) 108 (90 Base) MCG/ACT inhaler; Inhale 1-2 puffs into the lungs every 6 (six) hours as needed for wheezing or shortness of breath (Only as needed for shortness of breath).  Dispense: 1 each; Refill: 2 - albuterol (PROVENTIL) (2.5  MG/3ML) 0.083% nebulizer solution; Take 3 mLs (2.5 mg total) by nebulization every 6 (six) hours as needed. For shortness of breath.  Dispense: 75 mL; Refill: 1  4. Environmental and seasonal allergies New prescriptions for both albuterol rescue inhaler albuterol nebulizer solution sent to the patient's pharmacy.  He should use it as needed and as prescribed. - albuterol (VENTOLIN HFA) 108 (90 Base) MCG/ACT inhaler; Inhale 1-2 puffs into the lungs every 6 (six) hours as needed for wheezing or shortness of breath (Only as needed for shortness of breath).  Dispense: 1 each; Refill: 2 - albuterol (PROVENTIL) (2.5 MG/3ML) 0.083% nebulizer solution; Take 3 mLs (2.5 mg total) by nebulization every 6 (six) hours as needed. For shortness of breath.  Dispense: 75 mL; Refill: 1   Problem List Items Addressed This Visit       Respiratory   Childhood asthma without complication (Chronic)   Relevant Medications   methylPREDNISolone (MEDROL) 4 MG TBPK tablet   albuterol (VENTOLIN HFA) 108 (90 Base) MCG/ACT inhaler   albuterol (PROVENTIL) (2.5 MG/3ML) 0.083% nebulizer solution     Musculoskeletal and Integument   Contact dermatitis due to plant - Primary   Relevant Medications   methylPREDNISolone (MEDROL) 4 MG TBPK tablet   Rash and nonspecific skin eruption   Relevant Medications   methylPREDNISolone (MEDROL) 4 MG TBPK tablet   triamcinolone ointment (KENALOG) 0.1 %     Other   Environmental and seasonal allergies (Chronic)   Relevant Medications   albuterol (VENTOLIN HFA) 108 (90 Base) MCG/ACT inhaler   albuterol (PROVENTIL) (2.5 MG/3ML) 0.083% nebulizer solution     Meds ordered this encounter  Medications   methylPREDNISolone (MEDROL) 4 MG TBPK tablet    Sig: Take by mouth as directed for 6 days    Dispense:  21 tablet    Refill:  0    Order Specific Question:  Supervising Provider    Answer:   Nani Gasser D [[id number]]   triamcinolone ointment (KENALOG) 0.1 %    Sig: Apply 1  application topically 2 (two) times daily.    Dispense:  80 g    Refill:  2    Order Specific Question:   Supervising Provider    Answer:   Nani Gasser D [[id number]]   albuterol (VENTOLIN HFA) 108 (90 Base) MCG/ACT inhaler    Sig: Inhale 1-2 puffs into the lungs every 6 (six) hours as needed for wheezing or shortness of breath (Only as needed for shortness of breath).    Dispense:  1 each    Refill:  2    Order Specific Question:   Supervising Provider    Answer:   Nani Gasser D [[id number]]   albuterol (PROVENTIL) (2.5 MG/3ML) 0.083% nebulizer solution    Sig: Take 3 mLs (2.5 mg total) by nebulization every 6 (six) hours as needed. For shortness of breath.    Dispense:  75 mL    Refill:  1    Order Specific Question:   Supervising Provider    Answer:   Nani Gasser D [[id number]]     Carlean Jews, NP  This note was dictated using Dragon Voice Recognition Software. Rapid proofreading was performed to expedite the delivery of the information. Despite proofreading, phonetic errors will occur which are common with this voice recognition software. Please take this into consideration. If there are any concerns, please contact our office.

## 2021-04-24 DIAGNOSIS — L255 Unspecified contact dermatitis due to plants, except food: Secondary | ICD-10-CM | POA: Insufficient documentation

## 2021-04-24 DIAGNOSIS — R21 Rash and other nonspecific skin eruption: Secondary | ICD-10-CM | POA: Insufficient documentation

## 2021-07-24 ENCOUNTER — Telehealth: Payer: Self-pay | Admitting: Nurse Practitioner

## 2021-07-24 NOTE — Telephone Encounter (Signed)
Patient is aware 

## 2021-07-24 NOTE — Telephone Encounter (Signed)
Patient is going out of town tomorrow however had a hemorrhoid that is very painful and wants to know what he should do? [phone] ?

## 2021-07-24 NOTE — Telephone Encounter (Signed)
The first things I would do is to make sure he has tried preparation H as needed. If this has developed due to constipation, I recommend he do a trial of Miramax daily to help soften stool and promote regularity. I would avoid sitting for prolonged periods of time and definitely straining to use the bathroom. If he has done all this, he should probably be seen.

## 2021-07-26 ENCOUNTER — Encounter: Payer: Self-pay | Admitting: Physician Assistant

## 2021-07-26 ENCOUNTER — Other Ambulatory Visit: Payer: Self-pay

## 2021-07-26 ENCOUNTER — Ambulatory Visit (INDEPENDENT_AMBULATORY_CARE_PROVIDER_SITE_OTHER): Payer: BC Managed Care – PPO | Admitting: Physician Assistant

## 2021-07-26 VITALS — BP 122/86 | HR 84 | Temp 98.4°F | Ht 67.0 in | Wt 145.0 lb

## 2021-07-26 DIAGNOSIS — J4520 Mild intermittent asthma, uncomplicated: Secondary | ICD-10-CM

## 2021-07-26 DIAGNOSIS — J3089 Other allergic rhinitis: Secondary | ICD-10-CM

## 2021-07-26 DIAGNOSIS — K644 Residual hemorrhoidal skin tags: Secondary | ICD-10-CM

## 2021-07-26 MED ORDER — ALBUTEROL SULFATE HFA 108 (90 BASE) MCG/ACT IN AERS: 1.0000 | INHALATION_SPRAY | Freq: Four times a day (QID) | RESPIRATORY_TRACT | 2 refills | Status: DC | PRN

## 2021-07-26 MED ORDER — ALBUTEROL SULFATE (2.5 MG/3ML) 0.083% IN NEBU: 2.5000 mg | INHALATION_SOLUTION | Freq: Four times a day (QID) | RESPIRATORY_TRACT | 2 refills | Status: DC | PRN

## 2021-07-26 MED ORDER — LIDOCAINE-HYDROCORT (PERIANAL) 3-0.5 % EX CREA: TOPICAL_CREAM | CUTANEOUS | 1 refills | Status: AC

## 2021-07-26 NOTE — Patient Instructions (Signed)
Hemorrhoids °Hemorrhoids are swollen veins that may develop: °In the butt (rectum). These are called internal hemorrhoids. °Around the opening of the butt (anus). These are called external hemorrhoids. °Hemorrhoids can cause pain, itching, or bleeding. Most of the time, they do not cause serious problems. They usually get better with diet changes, lifestyle changes, and other home treatments. °What are the causes? °This condition may be caused by: °Having trouble pooping (constipation). °Pushing hard (straining) to poop. °Watery poop (diarrhea). °Pregnancy. °Being very overweight (obese). °Sitting for long periods of time. °Heavy lifting or other activity that causes you to strain. °Anal sex. °Riding a bike for a long period of time. °What are the signs or symptoms? °Symptoms of this condition include: °Pain. °Itching or soreness in the butt. °Bleeding from the butt. °Leaking poop. °Swelling in the area. °One or more lumps around the opening of your butt. °How is this diagnosed? °A doctor can often diagnose this condition by looking at the affected area. The doctor may also: °Do an exam that involves feeling the area with a gloved hand (digital rectal exam). °Examine the area inside your butt using a small tube (anoscope). °Order blood tests. This may be done if you have lost a lot of blood. °Have you get a test that involves looking inside the colon using a flexible tube with a camera on the end (sigmoidoscopy or colonoscopy). °How is this treated? °This condition can usually be treated at home. Your doctor may tell you to change what you eat, make lifestyle changes, or try home treatments. If these do not help, procedures can be done to remove the hemorrhoids or make them smaller. These may involve: °Placing rubber bands at the base of the hemorrhoids to cut off their blood supply. °Injecting medicine into the hemorrhoids to shrink them. °Shining a type of light energy onto the hemorrhoids to cause them to fall  off. °Doing surgery to remove the hemorrhoids or cut off their blood supply. °Follow these instructions at home: °Eating and drinking ° °Eat foods that have a lot of fiber in them. These include whole grains, beans, nuts, fruits, and vegetables. °Ask your doctor about taking products that have added fiber (fibersupplements). °Reduce the amount of fat in your diet. You can do this by: °Eating low-fat dairy products. °Eating less red meat. °Avoiding processed foods. °Drink enough fluid to keep your pee (urine) pale yellow. °Managing pain and swelling ° °Take a warm-water bath (sitz bath) for 20 minutes to ease pain. Do this 3-4 times a day. You may do this in a bathtub or using a portable sitz bath that fits over the toilet. °If told, put ice on the painful area. It may be helpful to use ice between your warm baths. °Put ice in a plastic bag. °Place a towel between your skin and the bag. °Leave the ice on for 20 minutes, 2-3 times a day. °General instructions °Take over-the-counter and prescription medicines only as told by your doctor. °Medicated creams and medicines may be used as told. °Exercise often. Ask your doctor how much and what kind of exercise is best for you. °Go to the bathroom when you have the urge to poop. Do not wait. °Avoid pushing too hard when you poop. °Keep your butt dry and clean. Use wet toilet paper or moist towelettes after pooping. °Do not sit on the toilet for a long time. °Keep all follow-up visits as told by your doctor. This is important. °Contact a doctor if you: °Have pain and   swelling that do not get better with treatment or medicine. °Have trouble pooping. °Cannot poop. °Have pain or swelling outside the area of the hemorrhoids. °Get help right away if you have: °Bleeding that will not stop. °Summary °Hemorrhoids are swollen veins in the butt or around the opening of the butt. °They can cause pain, itching, or bleeding. °Eat foods that have a lot of fiber in them. These include  whole grains, beans, nuts, fruits, and vegetables. °Take a warm-water bath (sitz bath) for 20 minutes to ease pain. Do this 3-4 times a day. °This information is not intended to replace advice given to you by your health care provider. Make sure you discuss any questions you have with your health care provider. °Document Revised: 10/26/2020 Document Reviewed: 10/26/2020 °Elsevier Patient Education © 2022 [employer] ° °

## 2021-07-26 NOTE — Progress Notes (Signed)
?  Established patient acute visit ? ? ?Patient: Nicholas Meadows   DOB: 1995-06-02   25 y.o. Male  MRN: [id number] ?Visit Date: 07/26/2021 ? ?Chief Complaint  ?Patient presents with  ? Acute Visit  ? ?Subjective  ?  ?HPI  ?Patient presents with c/o hemorrhoid. States has a history of hemorrhoids and has had a few episodes where they flare-up but nothing like this one. States having a difficult time sitting or sleeping at night due to significant pain. No unintentional weight loss, fever or rectal bleeding. Has been using preparation H with minimal relief. Did apply an ice pack which provided some comfort. States occasionally has constipation. Last bowel movement was this morning which was soft. Denies injury or trauma.  ? ? ? ?Medications: ?Outpatient Medications Prior to Visit  ?Medication Sig  ? triamcinolone ointment (KENALOG) 0.1 % Apply 1 application topically 2 (two) times daily.  ? [DISCONTINUED] albuterol (PROVENTIL) (2.5 MG/3ML) 0.083% nebulizer solution Take 3 mLs (2.5 mg total) by nebulization every 6 (six) hours as needed. For shortness of breath.  ? [DISCONTINUED] albuterol (VENTOLIN HFA) 108 (90 Base) MCG/ACT inhaler Inhale 1-2 puffs into the lungs every 6 (six) hours as needed for wheezing or shortness of breath (Only as needed for shortness of breath).  ? [DISCONTINUED] methylPREDNISolone (MEDROL) 4 MG TBPK tablet Take by mouth as directed for 6 days  ? ?No facility-administered medications prior to visit.  ? ? ?Review of Systems ? ? ?  Objective  ?  ?BP 122/86   Pulse 84   Temp 98.4 ?F (36.9 ?C)   Ht 5\' 7"  (1.702 m)   Wt 145 lb (65.8 kg)   SpO2 98%   BMI 22.71 kg/m?  ? ? ?Physical Exam  ?General:  Well Developed, well nourished, appropriate for stated age.  ?Neuro:  Alert and oriented,  extra-ocular muscles intact  ?HEENT:  Normocephalic, atraumatic, neck supple  ?Skin:  no gross rash, warm, pink. ?Cardiac:  RRR, S1 S2 ?Respiratory: CTA B/L  ?GU: inflamed external hemorrhoids (stage 3) without  signs of thrombosis  ?Vascular:  Ext warm, no cyanosis apprec.; cap RF less 2 sec. ?Psych:  No HI/SI, judgement and insight good, Euthymic mood. Full Affect. ? ? ?No results found for any visits on 07/26/21. ? Assessment & Plan  ?  ? ?Discussed with patient management options and prefers conservative therapy. Recommend to apply lidocaine-hydrocortisone twice daily as needed and sitz baths 20-30 mins BID. Discussed adequate fiber intake and avoid straining. If symptoms fail to improve or worsen recommend referral to gastroenterology. Pt verbalized understanding.  ? ? ?Return if symptoms worsen or fail to improve.  ?   ? ? ? ?07/28/21, PA-C  ?[facility] ?[phone] (phone) ?[phone] (fax) ? ?[facility] ?

## 2021-08-16 ENCOUNTER — Ambulatory Visit: Payer: BC Managed Care – PPO | Admitting: Nurse Practitioner

## 2022-08-13 ENCOUNTER — Encounter: Payer: Self-pay | Admitting: Nurse Practitioner

## 2022-08-13 ENCOUNTER — Ambulatory Visit: Payer: BC Managed Care – PPO | Admitting: Nurse Practitioner

## 2022-08-13 VITALS — BP 124/81 | HR 77 | Ht 67.0 in | Wt 153.0 lb

## 2022-08-13 DIAGNOSIS — J4520 Mild intermittent asthma, uncomplicated: Secondary | ICD-10-CM | POA: Diagnosis not present

## 2022-08-13 DIAGNOSIS — R21 Rash and other nonspecific skin eruption: Secondary | ICD-10-CM | POA: Diagnosis not present

## 2022-08-13 DIAGNOSIS — D485 Neoplasm of uncertain behavior of skin: Secondary | ICD-10-CM | POA: Diagnosis not present

## 2022-08-13 DIAGNOSIS — J3089 Other allergic rhinitis: Secondary | ICD-10-CM

## 2022-08-13 MED ORDER — ALBUTEROL SULFATE HFA 108 (90 BASE) MCG/ACT IN AERS: 1.0000 | INHALATION_SPRAY | Freq: Four times a day (QID) | RESPIRATORY_TRACT | 2 refills | Status: AC | PRN

## 2022-08-13 MED ORDER — TRIAMCINOLONE ACETONIDE 0.1 % EX OINT: 1.0000 | TOPICAL_OINTMENT | Freq: Two times a day (BID) | CUTANEOUS | 2 refills | Status: AC

## 2022-08-13 MED ORDER — ALBUTEROL SULFATE (2.5 MG/3ML) 0.083% IN NEBU: 2.5000 mg | INHALATION_SOLUTION | Freq: Four times a day (QID) | RESPIRATORY_TRACT | 2 refills | Status: AC | PRN

## 2022-08-13 NOTE — Progress Notes (Signed)
Established patient visit   Patient: Nicholas Meadows   DOB: [birth year]   27 y.o. Male  MRN: [id number] Visit Date: 08/13/2022  Chief Complaint  Patient presents with   Rash   Subjective    Has had rash, intermittently, since 2020 -would like to be referred to dermatology for evaluation of two moles on face and neck  -referral to dermatology due to rash also   Rash This is a chronic problem. The current episode started more than 1 year ago. The problem has been waxing and waning since onset. The affected locations include the left arm, left axilla, left hand, right axilla, right arm and right hand. The rash is characterized by dryness, burning, redness and scaling. He was exposed to chemicals (states that company he was getting deoderant from changed ingredients. also, running skin under hot water makes this worse.). Past treatments include topical steroids, anti-itch cream and antihistamine. The treatment provided moderate relief. His past medical history is significant for asthma and eczema.      Medications: Outpatient Medications Prior to Visit  Medication Sig   Lidocaine-Hydrocort, Perianal, 3-0.5 % CREA Apply topically two times daily as needed.   [DISCONTINUED] albuterol (PROVENTIL) (2.5 MG/3ML) 0.083% nebulizer solution Take 3 mLs (2.5 mg total) by nebulization every 6 (six) hours as needed. For shortness of breath.   [DISCONTINUED] albuterol (VENTOLIN HFA) 108 (90 Base) MCG/ACT inhaler Inhale 1-2 puffs into the lungs every 6 (six) hours as needed for wheezing or shortness of breath (Only as needed for shortness of breath).   [DISCONTINUED] triamcinolone ointment (KENALOG) 0.1 % Apply 1 application topically 2 (two) times daily.   No facility-administered medications prior to visit.    Review of Systems  Skin:  Positive for rash.     Objective     Today's Vitals   08/13/22 0952  BP: 124/81  Pulse: 77  SpO2: 99%  Weight: 153 lb (69.4 kg)  Height: 5\' 7"  (1.702 m)    Body mass index is 23.96 kg/m.   Physical Exam Vitals and nursing note reviewed.  Constitutional:      Appearance: Normal appearance. He is well-developed.  HENT:     Head: Normocephalic and atraumatic.     Nose: Nose normal.     Mouth/Throat:     Mouth: Mucous membranes are moist.     Pharynx: Oropharynx is clear.  Eyes:     Extraocular Movements: Extraocular movements intact.     Conjunctiva/sclera: Conjunctivae normal.     Pupils: Pupils are equal, round, and reactive to light.  Neck:     Vascular: No carotid bruit.  Cardiovascular:     Rate and Rhythm: Normal rate and regular rhythm.     Pulses: Normal pulses.     Heart sounds: Normal heart sounds.  Pulmonary:     Effort: Pulmonary effort is normal.     Breath sounds: Normal breath sounds.  Abdominal:     Palpations: Abdomen is soft.  Musculoskeletal:        General: Normal range of motion.     Cervical back: Normal range of motion and neck supple.  Lymphadenopathy:     Cervical: No cervical adenopathy.  Skin:    General: Skin is warm and dry.     Capillary Refill: Capillary refill takes less than 2 seconds.     Findings: Rash present.  Neurological:     General: No focal deficit present.     Mental Status: He is alert and oriented to person, place,  and time.  Psychiatric:        Mood and Affect: Mood normal.        Behavior: Behavior normal.        Thought Content: Thought content normal.        Judgment: Judgment normal.      Assessment & Plan     Rash and nonspecific skin eruption Assessment & Plan: Trial triamcinolone 0.1% cream - apply twice daily to effected areas as needed   Orders: -     Ambulatory referral to Dermatology -     Triamcinolone Acetonide; Apply 1 Application topically 2 (two) times daily.  Dispense: 80 g; Refill: 2  Neoplasm of uncertain behavior of skin of face Assessment & Plan: Refer to dermatology for further evaluation.   Orders: -     Ambulatory referral to  Dermatology  Neoplasm of uncertain behavior of skin of neck Assessment & Plan: Refer to dermatology for further evaluation.   Orders: -     Ambulatory referral to Dermatology  Mild intermittent childhood asthma without complication Assessment & Plan: Should continue to use rescue inhaler and/or nebulizer  treatments as needed and as prescribed for wheezing/SOB.    Orders: -     Albuterol Sulfate; Take 3 mLs (2.5 mg total) by nebulization every 6 (six) hours as needed. For shortness of breath.  Dispense: 75 mL; Refill: 2 -     Albuterol Sulfate HFA; Inhale 1-2 puffs into the lungs every 6 (six) hours as needed for wheezing or shortness of breath (Only as needed for shortness of breath).  Dispense: 1 each; Refill: 2  Environmental and seasonal allergies -     Albuterol Sulfate; Take 3 mLs (2.5 mg total) by nebulization every 6 (six) hours as needed. For shortness of breath.  Dispense: 75 mL; Refill: 2 -     Albuterol Sulfate HFA; Inhale 1-2 puffs into the lungs every 6 (six) hours as needed for wheezing or shortness of breath (Only as needed for shortness of breath).  Dispense: 1 each; Refill: 2     Return in about 4 months (around 12/13/2022) for health maintenance exam, FBW a week prior to visit.        Carlean Jews, NP  [facility] [phone] (phone) [phone] (fax)  [facility]

## 2022-09-11 DIAGNOSIS — D485 Neoplasm of uncertain behavior of skin: Secondary | ICD-10-CM | POA: Insufficient documentation

## 2022-09-11 NOTE — Assessment & Plan Note (Signed)
Should continue to use rescue inhaler and/or nebulizer  treatments as needed and as prescribed for wheezing/SOB.

## 2022-09-11 NOTE — Assessment & Plan Note (Signed)
Refer to dermatology for further evaluation. 

## 2022-09-11 NOTE — Assessment & Plan Note (Signed)
Trial triamcinolone 0.1% cream - apply twice daily to effected areas as needed

## 2022-12-05 ENCOUNTER — Other Ambulatory Visit: Payer: Self-pay | Admitting: Family Medicine

## 2022-12-05 DIAGNOSIS — Z0000 Encounter for general adult medical examination without abnormal findings: Secondary | ICD-10-CM

## 2022-12-17 ENCOUNTER — Other Ambulatory Visit: Payer: BC Managed Care – PPO

## 2022-12-17 DIAGNOSIS — Z0000 Encounter for general adult medical examination without abnormal findings: Secondary | ICD-10-CM

## 2022-12-18 LAB — COMPREHENSIVE METABOLIC PANEL
ALT: 24 IU/L (ref 0–44)
AST: 24 IU/L (ref 0–40)
Albumin: 4.5 g/dL (ref 4.3–5.2)
Alkaline Phosphatase: 80 IU/L (ref 44–121)
BUN/Creatinine Ratio: 14 (ref 9–20)
BUN: 13 mg/dL (ref 6–20)
Bilirubin Total: 0.4 mg/dL (ref 0.0–1.2)
CO2: 23 mmol/L (ref 20–29)
Calcium: 9.3 mg/dL (ref 8.7–10.2)
Chloride: 103 mmol/L (ref 96–106)
Creatinine, Ser: 0.91 mg/dL (ref 0.76–1.27)
Globulin, Total: 2.6 g/dL (ref 1.5–4.5)
Glucose: 85 mg/dL (ref 70–99)
Potassium: 3.9 mmol/L (ref 3.5–5.2)
Sodium: 141 mmol/L (ref 134–144)
Total Protein: 7.1 g/dL (ref 6.0–8.5)
eGFR: 120 mL/min/{1.73_m2} (ref >59)

## 2022-12-18 LAB — LIPID PANEL
Chol/HDL Ratio: 3.8 ratio (ref 0.0–5.0)
Cholesterol, Total: 214 mg/dL — ABNORMAL HIGH (ref 100–199)
HDL: 56 mg/dL
LDL Chol Calc (NIH): 139 mg/dL — ABNORMAL HIGH (ref 0–99)
Triglycerides: 104 mg/dL (ref 0–149)
VLDL Cholesterol Cal: 19 mg/dL (ref 5–40)

## 2022-12-18 LAB — CBC
Hematocrit: 42.5 % (ref 37.5–51.0)
Hemoglobin: 13.8 g/dL (ref 13.0–17.7)
MCH: 28 pg (ref 26.6–33.0)
MCHC: 32.5 g/dL (ref 31.5–35.7)
MCV: 86 fL (ref 79–97)
Platelets: 256 10*3/uL (ref 150–450)
RBC: 4.92 x10E6/uL (ref 4.14–5.80)
RDW: 13.9 % (ref 11.6–15.4)
WBC: 6 10*3/uL (ref 3.4–10.8)

## 2022-12-18 LAB — HEMOGLOBIN A1C
Est. average glucose Bld gHb Est-mCnc: 103 mg/dL
Hgb A1c MFr Bld: 5.2 % (ref 4.8–5.6)

## 2022-12-24 ENCOUNTER — Encounter: Payer: BC Managed Care – PPO | Admitting: Nurse Practitioner

## 2022-12-24 ENCOUNTER — Encounter: Payer: BC Managed Care – PPO | Admitting: Family Medicine

## 2022-12-24 NOTE — Progress Notes (Deleted)
Established Patient Office Visit  Subjective   Patient ID: Nicholas Meadows, male    DOB: 05-08-1995  Age: 27 y.o. MRN: [id number]  No chief complaint on file.   HPI  Asthma  Rash -   HLD - 139 LDL   The ASCVD Risk score (Arnett DK, et al., 2019) failed to calculate for the following reasons:   The 2019 ASCVD risk score is only valid for ages 7 to 56  Health Maintenance Due  Topic Date Due   COVID-19 Vaccine (1) Never done   HPV VACCINES (1 - Risk male 3-dose series) Never done   HIV Screening  Never done   Hepatitis C Screening  Never done   DTaP/Tdap/Td (7 - Td or Tdap) 12/21/2018   INFLUENZA VACCINE  11/29/2022      Objective:     There were no vitals taken for this visit. {Vitals History (Optional):23777}  Physical Exam   No results found for any visits on 12/24/22.      Assessment & Plan:   There are no diagnoses linked to this encounter.   No follow-ups on file.    Nicholas Kitty, MD

## 2023-02-11 ENCOUNTER — Encounter: Payer: Self-pay | Admitting: Dermatology

## 2023-02-11 ENCOUNTER — Ambulatory Visit: Payer: BC Managed Care – PPO | Admitting: Dermatology

## 2023-02-11 VITALS — BP 122/81 | HR 72

## 2023-02-11 DIAGNOSIS — L309 Dermatitis, unspecified: Secondary | ICD-10-CM

## 2023-02-11 MED ORDER — CLOBETASOL PROPIONATE 0.05 % EX CREA: 1.0000 | TOPICAL_CREAM | Freq: Two times a day (BID) | CUTANEOUS | 3 refills | Status: AC

## 2023-02-11 MED ORDER — TRIAMCINOLONE ACETONIDE 0.1 % EX OINT: 1.0000 | TOPICAL_OINTMENT | Freq: Every day | CUTANEOUS | 3 refills | Status: AC | PRN

## 2023-02-11 NOTE — Patient Instructions (Signed)
Hello Nicholas Meadows,  Thank you for visiting our clinic today. We appreciate your dedication to managing your health and skin condition. Below is a summary of the key instructions from today's consultation:  Diagnosis: Eczema - Medications Prescribed:   - Clobetasol Cream: Apply every morning and night for up to two weeks, followed by a two-week break to prevent skin thinning. Monitor effectiveness and adjust usage as needed.   - Triamcinolone Ointment: Continue use as it has been previously effective for underarm plaques.  - Skin Care Recommendations:   - Soap: Use Dove Unscented Bar Soap to minimize irritation.   - Showering: Avoid hot showers and apply moisturizer immediately afterward.   - Moisturizers: Continue using your current goat milk cream if it does not irritate your skin. Try the provided samples of Eucerin Eczema and Eucerin Advanced Repair lotions, along with the recommended body wash.  - Lifestyle Adjustments:   - Clothing: Wear long-sleeve, loose-fitting shirts to protect skin from irritants, especially at work.   - Stress Management: Manage stress as it can exacerbate skin conditions.  - Follow-Up:   - Appointment: Schedule a follow-up appointment in six months, or sooner if issues persist.   - Skin Check: Plan a skin check in two to three months to examine the spot over your eyebrow and the two-toned mole.   Please do not hesitate to contact us if you have any questions or need further assistance.  Warm regards,  Dr. [facility] Re[facility]    Important Information  Due to recent changes in healthcare laws, you may see results of your pathology and/or laboratory studies on MyChart before the doctors have had a chance to review them. We understand that in some cases there may be results that are confusing or concerning to you. Please understand that not all results are received at the same time and often the doctors may need to interpret multiple results in order to  provide you with the best plan of care or course of treatment. Therefore, we ask that you please give [place] 2 business days to thoroughly review all your results before contacting the office for clarification. Should we see a critical lab result, you will be contacted sooner.   If You Need Anything After Your Visit  If you have any questions or concerns for your doctor, please call our main line at [phone] If no one answers, please leave a voicemail as directed and we will return your call as soon as possible. Messages left after 4 pm will be answered the following business day.   You may also send [place] a message via MyChart. We typically respond to MyChart messages within 1-2 business days.  For prescription refills, please ask your pharmacy to contact our office. Our fax number is [phone].  If you have an urgent issue when the clinic is closed that cannot wait until the next business day, you can page your doctor at the number below.    Please note that while we do our best to be available for urgent issues outside of office hours, we are not available 24/7.   If you have an urgent issue and are unable to reach [place], you may choose to seek medical care at your doctor's office, retail clinic, urgent care center, or emergency room.  If you have a medical emergency, please immediately call 911 or go to the emergency department. In the event of inclement weather, please call our main line at [phone] for an update on the status of  any delays or closures.  Dermatology Medication Tips: Please keep the boxes that topical medications come in in order to help keep track of the instructions about where and how to use these. Pharmacies typically print the medication instructions only on the boxes and not directly on the medication tubes.   If your medication is too expensive, please contact our office at [phone] or send [place] a message through MyChart.   We are unable to tell what your  co-pay for medications will be in advance as this is different depending on your insurance coverage. However, we may be able to find a substitute medication at lower cost or fill out paperwork to get insurance to cover a needed medication.   If a prior authorization is required to get your medication covered by your insurance company, please allow [place] 1-2 business days to complete this process.  Drug prices often vary depending on where the prescription is filled and some pharmacies may offer cheaper prices.  The website www.goodrx.com contains coupons for medications through different pharmacies. The prices here do not account for what the cost may be with help from insurance (it may be cheaper with your insurance), but the website can give you the price if you did not use any insurance.  - You can print the associated coupon and take it with your prescription to the pharmacy.  - You may also stop by our office during regular business hours and pick up a GoodRx coupon card.  - If you need your prescription sent electronically to a different pharmacy, notify our office through [facility] or by phone at [phone]

## 2023-02-11 NOTE — Progress Notes (Signed)
   New Patient Visit   Subjective  Nicholas Meadows is a 27 y.o. male who presents for the following: rash  Pt has a rash that he gets on his arms and armpits and occasionally on his torso. First came out in 2020 and went away. He got poison ivy in 2022 and the rash came back and has been consistent since then. When it appears it sometimes gets itchy, the duration varies--sometimes for days, sometimes for months. His pcp gave him triamcinolone cream which helps, especially under his arms. He currently isn't using anything on it.    The patient has spots, moles and lesions to be evaluated, some may be new or changing and the patient may have concern these could be cancer.   The following portions of the chart were reviewed this encounter and updated as appropriate: medications, allergies, medical history  Review of Systems:  No other skin or systemic complaints except as noted in HPI or Assessment and Plan.  Objective  Well appearing patient in no apparent distress; mood and affect are within normal limits.  A focused examination was performed of the following areas: Arms, armpits, torso  Relevant exam findings are noted in the Assessment and Plan.  EXAM: Pink erythematous plaques bilateral forearms and axilla                   Assessment & Plan   ECZEMA  Chronic and persistent condition with duration or expected duration over one year. Condition is symptomatic/ bothersome to patient. Not currently at goal.   Atopic dermatitis (eczema) is a chronic, relapsing, pruritic condition that can significantly affect quality of life. It is often associated with allergic rhinitis and/or asthma and can require treatment with topical medications, phototherapy, or in severe cases biologic injectable medication (Dupixent; Adbry) or Oral JAK inhibitors.  Treatment Plan: -Use Dove unscented bar soap to minimize irritation. -Start clobetasol cream apply every morning and night for  up to two weeks, followed by a two-week break  -continue triamcinolone apply to axilla bid for up to 2 weeks Apply moisturizer after shower (samples of eucerin advanced repair and eczema relief given) along with goat milk cream.  Recommend gentle skin care.  Eczema, unspecified type    Return in about 3 months (around 05/14/2023) for TBSE, 6 months f/u eczema.  Owens Shark, CMA, am acting as scribe for [employer], DO.   Documentation: I have reviewed the above documentation for accuracy and completeness, and I agree with the above.  Langston Reusing, DO

## 2023-02-19 ENCOUNTER — Encounter: Payer: BC Managed Care – PPO | Admitting: Family Medicine

## 2023-02-19 NOTE — Progress Notes (Deleted)
   Established Patient Office Visit  Subjective   Patient ID: Nicholas Meadows, male    DOB: [birth year]  Age: 27 y.o. MRN: [id number]  No chief complaint on file.   HPI  Labs from wellness exam from 8/19.  Never went to wellness exam.  Cholesterol was a little bit elevated otherwise normal.  Eczema   The ASCVD Risk score (Arnett DK, et al., 2019) failed to calculate for the following reasons:   The 2019 ASCVD risk score is only valid for ages 69 to 4  Health Maintenance Due  Topic Date Due   COVID-19 Vaccine (1) Never done   HPV VACCINES (1 - Risk male 3-dose series) Never done   HIV Screening  Never done   Hepatitis C Screening  Never done   DTaP/Tdap/Td (7 - Td or Tdap) 12/21/2018   INFLUENZA VACCINE  11/29/2022      Objective:     There were no vitals taken for this visit. {Vitals History (Optional):23777}  Physical Exam   No results found for any visits on 02/19/23.      Assessment & Plan:   There are no diagnoses linked to this encounter.   No follow-ups on file.    Sandre Kitty, MD

## 2023-03-19 ENCOUNTER — Ambulatory Visit: Payer: BC Managed Care – PPO | Admitting: Dermatology

## 2023-04-01 ENCOUNTER — Ambulatory Visit: Payer: BC Managed Care – PPO | Admitting: Dermatology

## 2023-04-01 ENCOUNTER — Encounter: Payer: Self-pay | Admitting: Dermatology

## 2023-04-01 VITALS — BP 131/87

## 2023-04-01 DIAGNOSIS — Z808 Family history of malignant neoplasm of other organs or systems: Secondary | ICD-10-CM

## 2023-04-01 DIAGNOSIS — L578 Other skin changes due to chronic exposure to nonionizing radiation: Secondary | ICD-10-CM

## 2023-04-01 DIAGNOSIS — Z1283 Encounter for screening for malignant neoplasm of skin: Secondary | ICD-10-CM | POA: Diagnosis not present

## 2023-04-01 DIAGNOSIS — W908XXA Exposure to other nonionizing radiation, initial encounter: Secondary | ICD-10-CM

## 2023-04-01 DIAGNOSIS — L308 Other specified dermatitis: Secondary | ICD-10-CM | POA: Diagnosis not present

## 2023-04-01 DIAGNOSIS — L219 Seborrheic dermatitis, unspecified: Secondary | ICD-10-CM

## 2023-04-01 DIAGNOSIS — L814 Other melanin hyperpigmentation: Secondary | ICD-10-CM

## 2023-04-01 DIAGNOSIS — D229 Melanocytic nevi, unspecified: Secondary | ICD-10-CM

## 2023-04-01 DIAGNOSIS — L409 Psoriasis, unspecified: Secondary | ICD-10-CM

## 2023-04-01 DIAGNOSIS — D224 Melanocytic nevi of scalp and neck: Secondary | ICD-10-CM

## 2023-04-01 DIAGNOSIS — L309 Dermatitis, unspecified: Secondary | ICD-10-CM

## 2023-04-01 DIAGNOSIS — D1801 Hemangioma of skin and subcutaneous tissue: Secondary | ICD-10-CM

## 2023-04-01 MED ORDER — CLOBETASOL PROPIONATE 0.05 % EX SOLN
1.0000 | Freq: Two times a day (BID) | CUTANEOUS | 0 refills | Status: AC
Start: 2023-04-01 — End: ?

## 2023-04-01 MED ORDER — CLOBETASOL PROPIONATE 0.05 % EX CREA
1.0000 | TOPICAL_CREAM | Freq: Two times a day (BID) | CUTANEOUS | 0 refills | Status: AC
Start: 2023-04-01 — End: ?

## 2023-04-01 NOTE — Progress Notes (Signed)
Total Body Skin Exam (TBSE) Visit   Subjective  Nicholas Meadows is a 27 y.o. male who presents for the following: Skin Cancer Screening and Full Body Skin Exam  Patient presents today for follow up visit for TBSE. Patient was last evaluated on 02/11/23 for Eczema. Patient denies medication changes. Patient reports he  does have spots, moles and lesions of concern to be evaluated. Patient reports throughout his lifetime he  has had complete sun exposure. Currently, patient reports if he  has excessive sun exposure, he  at times apply sunscreen and/or wears protective coverings when he remembers. Patient reports he  has hx of bx but it was when it was younger. Patient reports  family history of skin cancers (maternal grandfather - unsure of type). The patient has spots, moles and lesions to be evaluated, some may be new or changing and the patient has concerns that these could be cancer.  The following portions of the chart were reviewed this encounter and updated as appropriate: medications, allergies, medical history  Review of Systems:  No other skin or systemic complaints except as noted in HPI or Assessment and Plan.  Objective  Well appearing patient in no apparent distress; mood and affect are within normal limits.  A full examination was performed including scalp, head, eyes, ears, nose, lips, neck, chest, axillae, abdomen, back, buttocks, bilateral upper extremities, bilateral lower extremities, hands, feet, fingers, toes, fingernails, and toenails. All findings within normal limits unless otherwise noted below.   Relevant physical exam findings are noted in the Assessment and Plan.    Assessment & Plan   1. Psoriasis/Eczematous Dermatitis - Assessment: Patient presents with a rash on arms and neck, initially thought to be hives but now appears consistent with psoriasis or eczematous dermatitis. Ineffectiveness of previous treatment with triamcinolone and clobetasol ointment noted.  Patient reports itchiness level of 6/10. Family history of psoriasis in mother identified, with stress as a significant trigger. - Plan: Switch to clobetasol cream, to be applied twice daily for two weeks on, two weeks off. Daily moisturizing with La Roche-Posay lotion recommended. Recommend CeraVe Anti-Itch cream and use of fragrance-free laundry detergent. Schedule follow-up appointment in 6-8 weeks. Consider systemic treatments if topicals are ineffective.  2. Seborrheic Dermatitis of Scalp - Assessment: Patient presents with severe seborrheic dermatitis of the scalp, characterized by a dry scalp. - Plan: Instruct patient to use DHS Zinc Shampoo as part of the regular hair washing routine, leaving it on for 3 minutes before rinsing. Apply clobetasol drops to the scalp.  3. Actinic Damage and Benign Nevi - Assessment: Full body examination revealed mild actinic damage on the shoulder, hyperpigmented macules, and several benign-appearing nevi. Notable findings include a larger intradermal nevus on the neck and a medium brown nevus on the shoulder, both assessed as benign. - Plan: Advise patient to apply sunscreen every 3 hours when at the beach and use a daily moisturizer with sunscreen for the face and neck. Continue regular skin checks.   BENIGN MELANOCYTIC NEVI - Tan-brown and/or pink-flesh-colored symmetric macules and papules - Benign appearing on exam today - Observation - Call clinic for new or changing moles - Recommend daily use of broad spectrum spf 30+ sunscreen to sun-exposed areas.   MILD ACTINIC DAMAGE - Chronic condition, secondary to cumulative UV/sun exposure - diffuse scaly erythematous macules with underlying dyspigmentation - Recommend daily broad spectrum sunscreen SPF 30+ to sun-exposed areas, reapply every 2 hours as needed.  - Staying in the shade or  wearing long sleeves, sun glasses (UVA+UVB protection) and wide brim hats (4-inch brim around the entire circumference  of the hat) are also recommended for sun protection.  - Call for new or changing lesions.  SKIN CANCER SCREENING PERFORMED TODAY.      No follow-ups on file.    Documentation: I have reviewed the above documentation for accuracy and completeness, and I agree with the above.   I, Shirron Marcha Solders, CMA, am acting as scribe for [employer], DO.   Langston Reusing, DO

## 2023-04-01 NOTE — Patient Instructions (Addendum)
Hello Nicholas Meadows,  Thank you for visiting [place] again. We are committed to your journey towards better skin health and appreciate your dedication. Here is a summary of the key instructions from today's consultation:  - Clobetasol Cream (Changing from ointment) : Continue applying the Clobetasol cream twice daily. Follow a regimen of two weeks on, then two weeks off.   - Moisturizing: Daily moisturization is crucial. Use CeraVe Anti-Itch or La Roche-Posay to manage dry skin and prevent flare-ups.    - Scalp Care: Utilize DHS Zinc Shampoo for your scalp, allowing it to sit for three minutes before rinsing. Additionally, apply Clobetasol drops to the affected areas on your scalp.   - Sun Protection: Ensure you apply sunscreen every three hours when at the beach. For daily use, incorporate a moisturizer with sunscreen for your face and neck.   - Laundry Detergent: Switch to a fragrance-free laundry detergent to minimize skin irritation.     - Follow-Up: We have scheduled a follow-up appointment in six weeks to monitor your condition and discuss further treatment options if necessary.   Please remember, managing stress is key to controlling your skin conditions. We are looking forward to your next visit and are hopeful for continued improvement in your skin health.  Warm regards,  Dr. Langston Reusing, Dermatology       Important Information  Due to recent changes in healthcare laws, you may see results of your pathology and/or laboratory studies on MyChart before the doctors have had a chance to review them. We understand that in some cases there may be results that are confusing or concerning to you. Please understand that not all results are received at the same time and often the doctors may need to interpret multiple results in order to provide you with the best plan of care or course of treatment. Therefore, we ask that you please give [place] 2 business days to thoroughly review all your results  before contacting the office for clarification. Should we see a critical lab result, you will be contacted sooner.   If You Need Anything After Your Visit  If you have any questions or concerns for your doctor, please call our main line at [phone] If no one answers, please leave a voicemail as directed and we will return your call as soon as possible. Messages left after 4 pm will be answered the following business day.   You may also send [place] a message via MyChart. We typically respond to MyChart messages within 1-2 business days.  For prescription refills, please ask your pharmacy to contact our office. Our fax number is [phone].  If you have an urgent issue when the clinic is closed that cannot wait until the next business day, you can page your doctor at the number below.    Please note that while we do our best to be available for urgent issues outside of office hours, we are not available 24/7.   If you have an urgent issue and are unable to reach [place], you may choose to seek medical care at your doctor's office, retail clinic, urgent care center, or emergency room.  If you have a medical emergency, please immediately call 911 or go to the emergency department. In the event of inclement weather, please call our main line at [phone] for an update on the status of any delays or closures.  Dermatology Medication Tips: Please keep the boxes that topical medications come in in order to help keep track of the instructions about  where and how to use these. Pharmacies typically print the medication instructions only on the boxes and not directly on the medication tubes.   If your medication is too expensive, please contact our office at [phone] or send [place] a message through MyChart.   We are unable to tell what your co-pay for medications will be in advance as this is different depending on your insurance coverage. However, we may be able to find a substitute medication at  lower cost or fill out paperwork to get insurance to cover a needed medication.   If a prior authorization is required to get your medication covered by your insurance company, please allow [place] 1-2 business days to complete this process.  Drug prices often vary depending on where the prescription is filled and some pharmacies may offer cheaper prices.  The website www.goodrx.com contains coupons for medications through different pharmacies. The prices here do not account for what the cost may be with help from insurance (it may be cheaper with your insurance), but the website can give you the price if you did not use any insurance.  - You can print the associated coupon and take it with your prescription to the pharmacy.  - You may also stop by our office during regular business hours and pick up a GoodRx coupon card.  - If you need your prescription sent electronically to a different pharmacy, notify our office through [facility] or by phone at [phone]

## 2024-05-05 ENCOUNTER — Ambulatory Visit: Payer: Self-pay | Admitting: Family Medicine

## 2024-06-03 ENCOUNTER — Encounter: Payer: Self-pay | Admitting: Family Medicine

## 2024-06-03 ENCOUNTER — Ambulatory Visit: Payer: Self-pay | Admitting: Family Medicine
